# Patient Record
Sex: Female | Born: 1945 | Race: Black or African American | Hispanic: No | Marital: Single | Smoking: Former smoker
Health system: Southern US, Community
[De-identification: ages and names within clinical notes are randomized; demographics above are authoritative.]

## PROBLEM LIST (undated history)

## (undated) DIAGNOSIS — F4322 Adjustment disorder with anxiety: Secondary | ICD-10-CM

## (undated) DIAGNOSIS — D751 Secondary polycythemia: Secondary | ICD-10-CM

## (undated) DIAGNOSIS — I890 Lymphedema, not elsewhere classified: Secondary | ICD-10-CM

## (undated) DIAGNOSIS — I82409 Acute embolism and thrombosis of unspecified deep veins of unspecified lower extremity: Secondary | ICD-10-CM

## (undated) DIAGNOSIS — E119 Type 2 diabetes mellitus without complications: Secondary | ICD-10-CM

## (undated) DIAGNOSIS — I10 Essential (primary) hypertension: Secondary | ICD-10-CM

## (undated) DIAGNOSIS — D45 Polycythemia vera: Secondary | ICD-10-CM

## (undated) HISTORY — PX: SPLENECTOMY: SUR1306

## (undated) HISTORY — PX: REPLACEMENT TOTAL KNEE: SUR1224

## (undated) HISTORY — PX: ABDOMINAL HYSTERECTOMY: SHX81

---

## 1990-03-16 HISTORY — PX: IVC FILTER INSERTION: CATH118245

## 2015-09-15 ENCOUNTER — Emergency Department: Payer: Medicare Other

## 2015-09-15 ENCOUNTER — Encounter: Payer: Self-pay | Admitting: Emergency Medicine

## 2015-09-15 ENCOUNTER — Emergency Department
Admission: EM | Admit: 2015-09-15 | Discharge: 2015-09-15 | Disposition: A | Discharge status: Home / Self Care | Payer: Medicare Other | Attending: Emergency Medicine | Admitting: Emergency Medicine

## 2015-09-15 DIAGNOSIS — M79671 Pain in right foot: Secondary | ICD-10-CM | POA: Diagnosis not present

## 2015-09-15 DIAGNOSIS — I82511 Chronic embolism and thrombosis of right femoral vein: Secondary | ICD-10-CM | POA: Insufficient documentation

## 2015-09-15 DIAGNOSIS — Z86718 Personal history of other venous thrombosis and embolism: Secondary | ICD-10-CM | POA: Insufficient documentation

## 2015-09-15 DIAGNOSIS — M79661 Pain in right lower leg: Secondary | ICD-10-CM | POA: Diagnosis present

## 2015-09-15 HISTORY — DX: Acute embolism and thrombosis of unspecified deep veins of unspecified lower extremity: I82.409

## 2015-09-15 HISTORY — DX: Other disorders of iron metabolism: E83.19

## 2015-09-15 HISTORY — DX: Polycythemia vera: D45

## 2015-09-15 NOTE — ED Notes (Signed)
Patient to ER for c/o foot swelling to right foot. States has h/o DVT. Was seen at urgent care yesterday and evaluated for foot swelling (had negative xray). Denies any pain to calf, but states foot is painful.

## 2015-09-15 NOTE — Discharge Instructions (Signed)

## 2015-09-15 NOTE — ED Provider Notes (Signed)
[facility] Emergency Department Provider Note  ____________________________________________    I have reviewed the triage vital signs and the nursing notes.   HISTORY  Chief Complaint Leg Pain    HPI April Alexander is a 70 y.o. female who presents with complaints of mild swelling to the right foot. Patient reports a long history of DVTs and is on Xarelto and also has an IVC filter. She reports she drove to [facility] 2 weeks ago and since then has had right foot swelling and discomfort. She had x-rays urgent care yesterday which were normal. She denies injury to the area. No shortness of breath as     Past Medical History  Diagnosis Date  . DVT (deep venous thrombosis) ([place])   . Polycythemia vera ([place])   . Iron overload     There are no active problems to display for this patient.   Past Surgical History  Procedure Laterality Date  . Replacement total knee Left   . Splenectomy    . Abdominal hysterectomy      No current outpatient prescriptions on file.  Allergies Bactrim and Neosporin  No family history on file.  Social History Social History  Substance Use Topics  . Smoking status: Never Smoker   . Smokeless tobacco: None  . Alcohol Use: Yes     Comment: occ.    Review of Systems  Constitutional: Negative for fever.   Respiratory: Negative for shortness of breath.  Musculoskeletal: Right foot pain as above Skin: Negative for rash. Neurological: Negative for focal weakness Psychiatric: no anxiety    ____________________________________________   PHYSICAL EXAM:  VITAL SIGNS: ED Triage Vitals  Enc Vitals Group     BP 09/15/15 1147 192/70 mmHg     Pulse Rate 09/15/15 1147 61     Resp 09/15/15 1147 18     Temp 09/15/15 1147 98.3 F (36.8 C)     Temp Source 09/15/15 1147 Oral     SpO2 09/15/15 1147 98 %     Weight 09/15/15 1147 238 lb (107.956 kg)     Height 09/15/15 1147 5\' 3"  (1.6 m)     Head Cir --      Peak  Flow --      Pain Score 09/15/15 1150 5     Pain Loc --      Pain Edu? --      Excl. in [place]? --    Constitutional: Alert and oriented. Well appearing and in no distress.  Eyes: Conjunctivae are normal. No erythema or injection ENT   Head: Normocephalic and atraumatic.   Mouth/Throat: Mucous membranes are moist. Cardiovascular: Normal rate, regular rhythm. Normal DP pulses bilateral lower extremities, feet warm and well perfused Respiratory: Normal respiratory effort without tachypnea nor retractions. Gastrointestinal: Soft and non-tender in all quadrants. No distention. There is no CVA tenderness. Genitourinary: deferred Musculoskeletal: Nontender with normal range of motion in all extremities. Patient was tender to palpation along the right lateral middle of the foot with mild swelling associated. Foot is warm and well perfused, full range of motion Neurologic:  Normal speech and language. No gross focal neurologic deficits are appreciated. Skin:  Skin is warm, dry and intact. No rash noted. Psychiatric: Mood and affect are normal. Patient exhibits appropriate insight and judgment.  ____________________________________________    LABS (pertinent positives/negatives)  Labs Reviewed - No data to display  ____________________________________________   EKG  None  ____________________________________________    RADIOLOGY  Ultrasound shows chronic right profunda femoral thrombus,  nonocclusive  ____________________________________________   PROCEDURES  Procedure(s) performed: none  Critical Care performed: none  ____________________________________________   INITIAL IMPRESSION / ASSESSMENT AND PLAN / ED COURSE  Pertinent labs & imaging results that were available during my care of the patient were reviewed by me and considered in my medical decision making (see chart for details).  Patient is concerned about DVT given her extensive history, although exam is  more consistent with inflammatory cause of pain. We will obtain ultrasound of the right lower extremity and reevaluate  Ultrasound shows likely chronic DVT, patient is already on Xarelto and has IVC filter. Do not think chronic DVT is the cause of the pain that she is experiencing. This seems far more muscular skeletal. She will treat with rice and Tylenol and follow-up with her PCP  ____________________________________________   FINAL CLINICAL IMPRESSION(S) / ED DIAGNOSES  Final diagnoses:  Foot pain, right  Chronic deep vein thrombosis (DVT) of femoral vein of right lower extremity (HCC)          Lavonia Drafts, MD 09/15/15 1535

## 2015-09-15 NOTE — ED Notes (Signed)
Pt transported to ultrasound.

## 2016-03-16 HISTORY — PX: PORTA CATH INSERTION: CATH118285

## 2016-06-05 ENCOUNTER — Other Ambulatory Visit: Payer: Self-pay | Admitting: Podiatry

## 2016-07-09 ENCOUNTER — Other Ambulatory Visit: Payer: Medicare Other

## 2016-07-28 ENCOUNTER — Encounter
Admission: RE | Admit: 2016-07-28 | Discharge: 2016-07-28 | Disposition: A | Discharge status: Home / Self Care | Payer: Medicare Other | Source: Ambulatory Visit | Attending: Podiatry | Admitting: Podiatry

## 2016-07-28 DIAGNOSIS — Z01810 Encounter for preprocedural cardiovascular examination: Secondary | ICD-10-CM | POA: Diagnosis present

## 2016-07-28 DIAGNOSIS — E119 Type 2 diabetes mellitus without complications: Secondary | ICD-10-CM | POA: Insufficient documentation

## 2016-07-28 DIAGNOSIS — Z01812 Encounter for preprocedural laboratory examination: Secondary | ICD-10-CM | POA: Insufficient documentation

## 2016-07-28 DIAGNOSIS — Z01818 Encounter for other preprocedural examination: Secondary | ICD-10-CM | POA: Diagnosis not present

## 2016-07-28 HISTORY — DX: Lymphedema, not elsewhere classified: I89.0

## 2016-07-28 HISTORY — DX: Type 2 diabetes mellitus without complications: E11.9

## 2016-07-28 HISTORY — DX: Secondary polycythemia: D75.1

## 2016-07-28 HISTORY — DX: Adjustment disorder with anxiety: F43.22

## 2016-07-28 HISTORY — DX: Essential (primary) hypertension: I10

## 2016-07-28 NOTE — Pre-Procedure Instructions (Signed)
Incentive spirometer given to patient; instructed on its use postoperatively. Patient acknowledges understanding.

## 2016-07-28 NOTE — Patient Instructions (Signed)
  Your procedure is scheduled on: Friday, Aug 07, 2016 Report to Same Day Surgery 2nd floor medical mall ([place] Entrance-take elevator on left to 2nd floor.  Check in with surgery information desk.) To find out your arrival time please call [phone] between 1PM - 3PM on Thursday, May 24  Remember: Instructions that are not followed completely may result in serious medical risk, up to and including death, or upon the discretion of your surgeon and anesthesiologist your surgery may need to be rescheduled.    _x___ 1. Do not eat food or drink liquids after midnight. No gum chewing or hard candies.      __x__ 2. No Alcohol for 24 hours before or after surgery.   __x__3. No Smoking for 24 prior to surgery.   ____  4. Bring all medications with you on the day of surgery if instructed.    __x__ 5. Notify your doctor if there is any change in your medical condition     (cold, fever, infections).     Do not wear jewelry, make-up, hairpins, clips or nail polish.  Do not wear lotions, powders, or perfumes. You may wear deodorant.  Do not shave 48 hours prior to surgery. Men may shave face and neck.  Do not bring valuables to the hospital.    [facility] is not responsible for any belongings or valuables.               Contacts, dentures or bridgework may not be worn into surgery.     Patients discharged the day of surgery will not be allowed to drive home.  You will need someone to drive you home and stay with you the night of your procedure.    Please read over the following fact sheets that you were given:   [facility] Preparing for Surgery and or MRSA Information   _x___ Take these medications the morning of surgery with a SIP of water the morning of surgery:   1. BYSTOLIC (NEBIVOLOL)    _x___ Use CHG Soap or sage wipes as directed on instruction sheet    _x___ Follow recommendations from HEMATOLOGIST regarding stopping XARELTO.  X____Stop Anti-inflammatories such as  Advil, Aleve, Ibuprofen, Motrin, Naproxen, Naprosyn, Goodies powders or aspirin products. OK to take Tylenol.   _x___ Stop supplements until after surgery; (CO-ENZYME 10)

## 2016-07-30 ENCOUNTER — Encounter (INDEPENDENT_AMBULATORY_CARE_PROVIDER_SITE_OTHER): Payer: Self-pay

## 2016-07-31 NOTE — Pre-Procedure Instructions (Signed)
EKG sent to Anesthesia for review. 

## 2016-08-06 MED ORDER — CEFAZOLIN SODIUM-DEXTROSE 2-4 GM/100ML-% IV SOLN
2.0000 g | INTRAVENOUS | Status: AC
  Administered 2016-08-07: 2 g via INTRAVENOUS

## 2016-08-07 ENCOUNTER — Ambulatory Visit
Admission: RE | Admit: 2016-08-07 | Discharge: 2016-08-07 | Disposition: A | Discharge status: Home / Self Care | Payer: Medicare Other | Source: Ambulatory Visit | Attending: Podiatry | Admitting: Podiatry

## 2016-08-07 ENCOUNTER — Encounter
Admission: RE | Disposition: A | Discharge status: Home / Self Care | Payer: Self-pay | Source: Ambulatory Visit | Attending: Podiatry

## 2016-08-07 ENCOUNTER — Ambulatory Visit: Payer: Medicare Other | Admitting: Certified Registered Nurse Anesthetist

## 2016-08-07 ENCOUNTER — Encounter: Payer: Self-pay | Admitting: *Deleted

## 2016-08-07 DIAGNOSIS — M2042 Other hammer toe(s) (acquired), left foot: Secondary | ICD-10-CM | POA: Diagnosis not present

## 2016-08-07 DIAGNOSIS — M205X2 Other deformities of toe(s) (acquired), left foot: Secondary | ICD-10-CM | POA: Diagnosis present

## 2016-08-07 DIAGNOSIS — E119 Type 2 diabetes mellitus without complications: Secondary | ICD-10-CM | POA: Diagnosis not present

## 2016-08-07 DIAGNOSIS — F4322 Adjustment disorder with anxiety: Secondary | ICD-10-CM | POA: Insufficient documentation

## 2016-08-07 DIAGNOSIS — Z79899 Other long term (current) drug therapy: Secondary | ICD-10-CM | POA: Insufficient documentation

## 2016-08-07 DIAGNOSIS — Z7984 Long term (current) use of oral hypoglycemic drugs: Secondary | ICD-10-CM | POA: Diagnosis not present

## 2016-08-07 DIAGNOSIS — M2022 Hallux rigidus, left foot: Secondary | ICD-10-CM | POA: Insufficient documentation

## 2016-08-07 DIAGNOSIS — Z87891 Personal history of nicotine dependence: Secondary | ICD-10-CM | POA: Insufficient documentation

## 2016-08-07 DIAGNOSIS — I10 Essential (primary) hypertension: Secondary | ICD-10-CM | POA: Diagnosis not present

## 2016-08-07 HISTORY — PX: ARTHRODESIS METATARSALPHALANGEAL JOINT (MTPJ): SHX6566

## 2016-08-07 HISTORY — PX: HAMMER TOE SURGERY: SHX385

## 2016-08-07 LAB — GLUCOSE, CAPILLARY
Glucose-Capillary: 122 mg/dL — ABNORMAL HIGH (ref 65–99)
Glucose-Capillary: 113 mg/dL — ABNORMAL HIGH (ref 65–99)

## 2016-08-07 SURGERY — FUSION, JOINT, GREAT TOE
Anesthesia: General | Laterality: Left

## 2016-08-07 MED ORDER — ENOXAPARIN SODIUM 40 MG/0.4ML ~~LOC~~ SOLN
40.0000 mg | SUBCUTANEOUS | Status: AC
  Administered 2016-08-07: 40 mg via SUBCUTANEOUS
  Filled 2016-08-07: qty 0.4

## 2016-08-07 MED ORDER — HYDROCODONE-ACETAMINOPHEN 5-325 MG PO TABS: 1.0000 | ORAL_TABLET | Freq: Four times a day (QID) | ORAL | 0 refills | Status: AC | PRN

## 2016-08-07 MED ORDER — FENTANYL CITRATE (PF) 100 MCG/2ML IJ SOLN
INTRAMUSCULAR | Status: DC | PRN
  Administered 2016-08-07 (×2): 25 ug via INTRAVENOUS

## 2016-08-07 MED ORDER — CHLORHEXIDINE GLUCONATE 4 % EX LIQD: 60.0000 mL | Freq: Once | CUTANEOUS | Status: DC

## 2016-08-07 MED ORDER — ONDANSETRON HCL 4 MG/2ML IJ SOLN
4.0000 mg | Freq: Once | INTRAMUSCULAR | Status: DC | PRN
Start: 2016-08-07 — End: 2016-08-07

## 2016-08-07 MED ORDER — MIDAZOLAM HCL 2 MG/2ML IJ SOLN
INTRAMUSCULAR | Status: AC
  Administered 2016-08-07: 1.5 mg via INTRAVENOUS
  Filled 2016-08-07: qty 2

## 2016-08-07 MED ORDER — LIDOCAINE-EPINEPHRINE 1 %-1:100000 IJ SOLN
INTRAMUSCULAR | Status: AC
  Filled 2016-08-07: qty 1

## 2016-08-07 MED ORDER — PROPOFOL 10 MG/ML IV BOLUS
INTRAVENOUS | Status: DC | PRN
  Administered 2016-08-07: 200 mg via INTRAVENOUS

## 2016-08-07 MED ORDER — ACETAMINOPHEN 10 MG/ML IV SOLN
INTRAVENOUS | Status: AC
  Filled 2016-08-07: qty 100

## 2016-08-07 MED ORDER — FENTANYL CITRATE (PF) 100 MCG/2ML IJ SOLN
INTRAMUSCULAR | Status: AC
  Filled 2016-08-07: qty 2

## 2016-08-07 MED ORDER — LACTATED RINGERS IV SOLN
INTRAVENOUS | Status: DC | PRN
  Administered 2016-08-07: 12:00:00 via INTRAVENOUS

## 2016-08-07 MED ORDER — BUPIVACAINE HCL (PF) 0.25 % IJ SOLN
INTRAMUSCULAR | Status: AC
  Filled 2016-08-07: qty 30

## 2016-08-07 MED ORDER — ROPIVACAINE HCL 5 MG/ML IJ SOLN
INTRAMUSCULAR | Status: AC
  Filled 2016-08-07: qty 30

## 2016-08-07 MED ORDER — MIDAZOLAM HCL 2 MG/2ML IJ SOLN
INTRAMUSCULAR | Status: AC
  Filled 2016-08-07: qty 2

## 2016-08-07 MED ORDER — PHENYLEPHRINE HCL 10 MG/ML IJ SOLN
INTRAMUSCULAR | Status: DC | PRN
  Administered 2016-08-07 (×2): 50 ug via INTRAVENOUS

## 2016-08-07 MED ORDER — ACETAMINOPHEN 10 MG/ML IV SOLN
INTRAVENOUS | Status: DC | PRN
Start: 2016-08-07 — End: 2016-08-07
  Administered 2016-08-07: 1000 mg via INTRAVENOUS

## 2016-08-07 MED ORDER — LIDOCAINE HCL (PF) 2 % IJ SOLN
INTRAMUSCULAR | Status: AC
  Filled 2016-08-07: qty 2

## 2016-08-07 MED ORDER — MIDAZOLAM HCL 2 MG/2ML IJ SOLN
INTRAMUSCULAR | Status: DC | PRN
  Administered 2016-08-07: 2 mg via INTRAVENOUS

## 2016-08-07 MED ORDER — PROMETHAZINE HCL 12.5 MG PO TABS: 12.5000 mg | ORAL_TABLET | Freq: Four times a day (QID) | ORAL | 0 refills | Status: AC | PRN

## 2016-08-07 MED ORDER — PROPOFOL 10 MG/ML IV BOLUS
INTRAVENOUS | Status: AC
  Filled 2016-08-07: qty 20

## 2016-08-07 MED ORDER — FAMOTIDINE 20 MG PO TABS
ORAL_TABLET | ORAL | Status: AC
  Administered 2016-08-07: 20 mg via ORAL
  Filled 2016-08-07: qty 1

## 2016-08-07 MED ORDER — BUPIVACAINE HCL (PF) 0.5 % IJ SOLN
INTRAMUSCULAR | Status: AC
  Filled 2016-08-07: qty 30

## 2016-08-07 MED ORDER — LIDOCAINE-EPINEPHRINE (PF) 1 %-1:200000 IJ SOLN
INTRAMUSCULAR | Status: AC
  Filled 2016-08-07: qty 30

## 2016-08-07 MED ORDER — FENTANYL CITRATE (PF) 100 MCG/2ML IJ SOLN: 25.0000 ug | INTRAMUSCULAR | Status: DC | PRN

## 2016-08-07 MED ORDER — CEFAZOLIN SODIUM-DEXTROSE 2-4 GM/100ML-% IV SOLN
INTRAVENOUS | Status: AC
  Filled 2016-08-07: qty 100

## 2016-08-07 MED ORDER — ONDANSETRON HCL 4 MG/2ML IJ SOLN
INTRAMUSCULAR | Status: DC | PRN
  Administered 2016-08-07: 4 mg via INTRAVENOUS

## 2016-08-07 MED ORDER — LIDOCAINE HCL (PF) 1 % IJ SOLN
INTRAMUSCULAR | Status: AC
  Filled 2016-08-07: qty 30

## 2016-08-07 MED ORDER — SODIUM CHLORIDE 0.9 % IV SOLN
INTRAVENOUS | Status: DC
  Administered 2016-08-07: 09:00:00 via INTRAVENOUS

## 2016-08-07 MED ORDER — LIDOCAINE HCL (PF) 1 % IJ SOLN
INTRAMUSCULAR | Status: AC
  Filled 2016-08-07: qty 5

## 2016-08-07 MED ORDER — ONDANSETRON HCL 4 MG PO TABS: 4.0000 mg | ORAL_TABLET | Freq: Four times a day (QID) | ORAL | Status: DC | PRN

## 2016-08-07 MED ORDER — MIDAZOLAM HCL 2 MG/2ML IJ SOLN
1.5000 mg | Freq: Once | INTRAMUSCULAR | Status: AC
  Administered 2016-08-07: 1.5 mg via INTRAVENOUS

## 2016-08-07 MED ORDER — LIDOCAINE HCL (CARDIAC) 20 MG/ML IV SOLN
INTRAVENOUS | Status: DC | PRN
  Administered 2016-08-07: 100 mg via INTRAVENOUS

## 2016-08-07 MED ORDER — BUPIVACAINE HCL (PF) 0.25 % IJ SOLN
INTRAMUSCULAR | Status: DC | PRN
  Administered 2016-08-07: 10 mL

## 2016-08-07 MED ORDER — LIDOCAINE-EPINEPHRINE 1 %-1:100000 IJ SOLN
INTRAMUSCULAR | Status: DC | PRN
  Administered 2016-08-07: 7 mL

## 2016-08-07 MED ORDER — ONDANSETRON HCL 4 MG/2ML IJ SOLN: 4.0000 mg | Freq: Four times a day (QID) | INTRAMUSCULAR | Status: DC | PRN

## 2016-08-07 MED ORDER — HYDROCODONE-ACETAMINOPHEN 5-325 MG PO TABS: 1.0000 | ORAL_TABLET | ORAL | Status: DC | PRN

## 2016-08-07 MED ORDER — FAMOTIDINE 20 MG PO TABS
20.0000 mg | ORAL_TABLET | Freq: Once | ORAL | Status: AC
  Administered 2016-08-07: 20 mg via ORAL

## 2016-08-07 SURGICAL SUPPLY — 58 items
1.3MM SQUARE DRIVER IMPLANT
BANDAGE ELASTIC 4 LF NS (GAUZE/BANDAGES/DRESSINGS) ×2 IMPLANT
BANDAGE STRETCH 3X4.1 STRL (GAUZE/BANDAGES/DRESSINGS) ×2 IMPLANT
BIT DRILL 2 FAST STEP (BIT) ×2 IMPLANT
BIT DRILL 2.4 AO COUPLING CANN (BIT) ×2 IMPLANT
BLADE OSC/SAGITTAL MD 5.5X18 (BLADE) ×2 IMPLANT
BLADE SURG 15 STRL LF DISP TIS (BLADE) ×2 IMPLANT
BLADE SURG 15 STRL SS (BLADE) ×2
BLADE SURG MINI STRL (BLADE) ×2 IMPLANT
BNDG ESMARK 4X12 TAN STRL LF (GAUZE/BANDAGES/DRESSINGS) ×2 IMPLANT
BNDG GAUZE 4.5X4.1 6PLY STRL (MISCELLANEOUS) ×2 IMPLANT
BOOT LEFT CAM WALKER (MISCELLANEOUS) ×1 IMPLANT
COMPRESSION WIRE, SHORT IMPLANT
DEVICE LEFT CAM WALKER (MISCELLANEOUS) ×2
DRIVER BIT SQUARE 1.3MM (TRAUMA) ×4 IMPLANT
DURAPREP 26ML APPLICATOR (WOUND CARE) ×2 IMPLANT
ELECT REM PT RETURN 9FT ADLT (ELECTROSURGICAL) ×2
ELECTRODE REM PT RTRN 9FT ADLT (ELECTROSURGICAL) ×1 IMPLANT
FIXATION HAMMERTOE ANGLD 15MM (Toe) ×3 IMPLANT
GAUZE PETRO XEROFOAM 1X8 (MISCELLANEOUS) ×2 IMPLANT
GAUZE SPONGE 4X4 12PLY STRL (GAUZE/BANDAGES/DRESSINGS) ×2 IMPLANT
GAUZE STRETCH 2X75IN STRL (MISCELLANEOUS) ×2 IMPLANT
GLOVE BIO SURGEON STRL SZ7.5 (GLOVE) ×4 IMPLANT
GLOVE INDICATOR 8.0 STRL GRN (GLOVE) ×4 IMPLANT
GOWN STRL REUS W/ TWL LRG LVL3 (GOWN DISPOSABLE) ×2 IMPLANT
GOWN STRL REUS W/TWL LRG LVL3 (GOWN DISPOSABLE) ×2
HAMMERTOE ANGLED 15MM 5MM (Toe) ×6 IMPLANT
K-WIRE ACE 1.6X6 (WIRE) ×2
K-WIRE TROC 1.25X150 (WIRE) ×2
KIT DRILL HAMMERLOCK2 IMPLANT (BIT) ×2 IMPLANT
KIT RM TURNOVER STRD PROC AR (KITS) ×2 IMPLANT
KWIRE ACE 1.6X6 (WIRE) ×1 IMPLANT
KWIRE TROC 1.25X150 (WIRE) ×1 IMPLANT
LABEL OR SOLS (LABEL) IMPLANT
NEEDLE FILTER BLUNT 18X 1/2SAF (NEEDLE) ×1
NEEDLE FILTER BLUNT 18X1 1/2 (NEEDLE) ×1 IMPLANT
NEEDLE HYPO 25X1 1.5 SAFETY (NEEDLE) ×6 IMPLANT
NS IRRIG 500ML POUR BTL (IV SOLUTION) ×2 IMPLANT
PACK EXTREMITY ARMC (MISCELLANEOUS) ×2 IMPLANT
PAD CAST CTTN 4X4 STRL (SOFTGOODS) ×1 IMPLANT
PADDING CAST COTTON 4X4 STRL (SOFTGOODS) ×1
PEG FULLY THREADED 2.5X22MM (Peg) ×2 IMPLANT
PENCIL ELECTRO HAND CTR (MISCELLANEOUS) ×2 IMPLANT
PLATE LOCK 1ST LT MTP (Plate) ×2 IMPLANT
RASP SM TEAR CROSS CUT (RASP) ×2 IMPLANT
SCREW PEG 2.5X20 NONLOCK (Screw) ×2 IMPLANT
SCREW PEG LOCK 2.5X10 (Peg) ×2 IMPLANT
SCREW PEG LOCK 2.5X14 (Peg) ×2 IMPLANT
SCREW PEG LOCK 2.5X16 (Peg) ×6 IMPLANT
SCREW PT 4.0X26MM (Screw) ×2 IMPLANT
SPONGE XRAY 4X4 16PLY STRL (MISCELLANEOUS) ×2 IMPLANT
STOCKINETTE M/LG 89821 (MISCELLANEOUS) ×2 IMPLANT
STRIP CLOSURE SKIN 1/4X4 (GAUZE/BANDAGES/DRESSINGS) ×2 IMPLANT
SUT MON AB 5-0 P3 18 (SUTURE) ×2 IMPLANT
SUT VIC AB 4-0 FS2 27 (SUTURE) ×6 IMPLANT
SYRINGE 10CC LL (SYRINGE) ×4 IMPLANT
WIRE COMPRESSION 23 SHORT (WIRE) ×4 IMPLANT
WIRE Z .062 C-WIRE SPADE TIP (WIRE) IMPLANT

## 2016-08-07 NOTE — Anesthesia Post-op Follow-up Note (Cosign Needed)
Anesthesia QCDR form completed.        

## 2016-08-07 NOTE — Op Note (Signed)
Operative note   Surgeon:Markanthony Gedney Vickki Muff    Assistant:Dr. Sharlotte Alamo     Preop diagnosis:1. Left foot hallux limitus/rigidus 2.Hammertoes 2,3,4 left foot.    Postop diagnosis:Same    Procedure: 1. Left first MTPJ fusion 2. Hammertoe repair with PIPJ arthrodesis with hammerlock implants second, third, fourth toes all left foot    EBL: 10 cc    Anesthesia:regional and general    Hemostasis: Epinephrine infiltrated along the incision sites    Specimen: None    Complications: None    Operative indications:April Alexander is an 71 y.o. that presents today for surgical intervention.  The risks/benefits/alternatives/complications have been discussed and consent has been given.    Procedure:  Patient was brought into the OR and placed on the operating table in thesupine position. After anesthesia was obtained theleft lower extremity was prepped and draped in usual sterile fashion.  Attention was directed to the dorsomedial left first MTPJ where an incision was performed. Sharp and blunt dissection carried down to the capsule. A longitudinal capsulotomy was performed. The head of the metatarsal and base of the proximal phalanx were exposed. There was virtually no articular cartilage remaining. At this time what little bit of cartilage remaining was removed with a curette. Next a cup and cone reamer were used to remove the subchondral bone plate down to good healthy bleeding bone proximal and distal. At this time the toe was held in a anatomic position with slight dorsiflexion and alignment with the lesser toes. The fusion site was prepped with drilling with a 2.0 mm drill bit. A dorsal locking Biomet small MTPJ fusion plate was initially fashion with 1 locking screw distal and a compression screw proximal. Next a 4.0 mm compression cannulated screw was placed from distal medial to proximal lateral with good alignment and compression noted. The remaining plate was filled with locking screws at this  time. The compression screw was removed from the plate. Good stability and alignment was noted in all planes. The wound was flushed with copious amounts or irrigation and layered closure was performed with a 3-0 Vicryl the Deeper Layer and 4-0 Vicryl for the Subcutaneous Tissue. The skin was closed with 5-0 Monocryl at the end of the case.  Attention was then directed to the second third and fourth toes were longitudinal incision was made overlying the PIPJ's. Sharp and blunt dissection down to the long extensor tendon. These were transected and reflected back exposing the head of the proximal phalanx and base of the middle phalanx. The head of the proximal phalanx were removed to the surgical neck and the cartilage from the base of the middle phalanx were removed. Next using standard technique 3 medium sized curved hammerlock implants were placed into the second third and fourth PIPJ's perfusion. Good compression and stability was noted with good alignment noted. All wounds were flushed with copious amounts or irrigation. Closure was then performed with a 4-0 Vicryl for the extensor tendon and subcutaneous tissue and a 4-0 nylon for skin.    Patient tolerated the procedure and anesthesia well.  Was transported from the OR to the PACU with all vital signs stable and vascular status intact. To be discharged per routine protocol.  Will follow up in approximately 1 week in the outpatient clinic.

## 2016-08-07 NOTE — Anesthesia Procedure Notes (Signed)
Anesthesia Regional Block: Popliteal block   Pre-Anesthetic Checklist: ,, timeout performed, Correct Patient, Correct Site, Correct Laterality, Correct Procedure, Correct Position, site marked, Risks and benefits discussed,  Surgical consent,  Pre-op evaluation,  At surgeon's request and post-op pain management  Laterality: Lower and Left  Prep: chloraprep       Needles:  Injection technique: Single-shot  Needle Type: Echogenic Needle     Needle Length: 9cm  Needle Gauge: 21     Additional Needles:   Procedures: ultrasound guided,,,,,,,,  Narrative:  Start time: 08/07/2016 9:00 AM End time: 08/07/2016 9:12 AM Injection made incrementally with aspirations every 5 mL.  Performed by: Personally  Anesthesiologist: Katy Fitch K  Additional Notes: Time out performed with Functioning IV  confirmed and monitors applied.  An echogenic needle was used. Sterile prep,hand hygiene and sterile gloves were used. Minimal sedation used for procedure.   No paresthesia endorsed by patient during the procedure.  Negative aspiration and negative test dose prior to incremental administration of local anesthetic. The patient tolerated the procedure well with no immediate complications.

## 2016-08-07 NOTE — H&P (Signed)
HISTORY AND PHYSICAL INTERVAL NOTE:  08/07/2016  8:54 AM  April Alexander  has presented today for surgery, with the diagnosis of Hallux rigidus-Left   Hammertoe-Left  .  The various methods of treatment have been discussed with the patient.  No guarantees were given.  After consideration of risks, benefits and other options for treatment, the patient has consented to surgery.  I have reviewed the patients' chart and labs.    Patient Vitals for the past 24 hrs:  BP Temp Temp src Pulse Resp SpO2  08/07/16 0847 (!) 185/81 - - (!) 53 12 100 %  08/07/16 0804 (!) 181/77 97.6 F (36.4 C) Oral 69 16 99 %    A history and physical examination was performed in my office.  The patient was reexamined.  There have been no changes to this history and physical examination.  Samara Deist A

## 2016-08-07 NOTE — Anesthesia Procedure Notes (Signed)
Procedure Name: LMA Insertion Performed by: Demetrius Charity Pre-anesthesia Checklist: Patient identified, Patient being monitored, Timeout performed, Emergency Drugs available and Suction available Patient Re-evaluated:Patient Re-evaluated prior to inductionOxygen Delivery Method: Circle system utilized Preoxygenation: Pre-oxygenation with 100% oxygen Intubation Type: IV induction Ventilation: Mask ventilation without difficulty LMA: LMA inserted LMA Size: 3.0 Tube type: Oral Number of attempts: 1 Placement Confirmation: positive ETCO2 and breath sounds checked- equal and bilateral Tube secured with: Tape Dental Injury: Teeth and Oropharynx as per pre-operative assessment

## 2016-08-07 NOTE — Discharge Instructions (Signed)
[facility] °[facility] ° °POST OPERATIVE INSTRUCTIONS FOR DR. TROXLER AND DR. FOWLER °[facility] PODIATRY [facility]PART[facility] ° ° °1. Take your medication as prescribed.  Pain medication should be taken only as needed. ° °2. Keep the dressing clean, dry and intact. ° °3. Keep your foot elevated above the heart level for the first 48 hours. ° °4. Walking to the bathroom and brief periods of walking are acceptable, unless we have instructed you to be non-weight bearing. ° °5. Always wear your post-op shoe when walking.  Always use your crutches if you are to be non-weight bearing. ° °6. Do not take a shower. Baths are permissible as long as the foot is kept out of the water.  ° °7. Every hour you are awake:  °- Bend your knee 15 times. °- Flex foot 15 times °- Massage calf 15 times ° °8. Call [facility] ([phone]) if any of the following problems occur: °- You develop a temperature or fever. °- The bandage becomes saturated with blood. °- Medication does not stop your pain. °- Injury of the foot occurs. °- Any symptoms of infection including redness, odor, or red streaks running from wound. ° ° ° ° °AMBULATORY SURGERY  °DISCHARGE INSTRUCTIONS ° ° °1) The drugs that you were given will stay in your system until tomorrow so for the next 24 hours you should not: ° °A) Drive an automobile °B) Make any legal decisions °C) Drink any alcoholic beverage ° ° °2) You may resume regular meals tomorrow.  Today it is better to start with liquids and gradually work up to solid foods. ° °You may eat anything you prefer, but it is better to start with liquids, then soup and crackers, and gradually work up to solid foods. ° ° °3) Please notify your doctor immediately if you have any unusual bleeding, trouble breathing, redness and pain at the surgery site, drainage, fever, or pain not relieved by medication. ° ° ° °4) Additional Instructions: ° ° ° ° ° ° ° °Please contact your physician with any  problems or Same Day Surgery at [phone], Monday through Friday 6 am to 4 pm, or [facility] number at [phone]. ° °

## 2016-08-07 NOTE — Transfer of Care (Signed)
Immediate Anesthesia Transfer of Care Note  Patient: April Alexander  Procedure(s) Performed: Procedure(s): ARTHRODESIS METATARSALPHALANGEAL JOINT (MTPJ)-Left  (Left) HAMMER TOE CORRECTION-Left 2nd, 3rd & 4th left toes  (Left)  Patient Location: PACU  Anesthesia Type:General  Level of Consciousness: awake, alert  and oriented  Airway & Oxygen Therapy: Patient Spontanous Breathing and Patient connected to face mask oxygen  Post-op Assessment: Report given to RN and Post -op Vital signs reviewed and stable  Post vital signs: Reviewed and stable  Last Vitals:  Vitals:   08/07/16 0922 08/07/16 0927  BP: (!) 170/66   Pulse: (!) 53 (!) 50  Resp: 16 16  Temp:      Last Pain:  Vitals:   08/07/16 0847  TempSrc:   PainSc: 0-No pain         Complications: No apparent anesthesia complications

## 2016-08-07 NOTE — Anesthesia Preprocedure Evaluation (Signed)
Anesthesia Evaluation  Patient identified by MRN, date of birth, ID band Patient awake    Reviewed: Allergy & Precautions, H&P , NPO status , Patient's Chart, lab work & pertinent test results, reviewed documented beta blocker date and time   Airway Mallampati: II  TM Distance: >3 FB Neck ROM: full    Dental  (+) Teeth Intact   Pulmonary neg pulmonary ROS, former smoker,    Pulmonary exam normal        Cardiovascular Exercise Tolerance: Good hypertension, negative cardio ROS Normal cardiovascular exam Rate:Normal     Neuro/Psych PSYCHIATRIC DISORDERS negative neurological ROS  negative psych ROS   GI/Hepatic negative GI ROS, Neg liver ROS,   Endo/Other  negative endocrine ROSdiabetes  Renal/GU negative Renal ROS  negative genitourinary   Musculoskeletal   Abdominal   Peds  Hematology negative hematology ROS (+)   Anesthesia Other Findings   Reproductive/Obstetrics negative OB ROS                             Anesthesia Physical Anesthesia Plan  ASA: II  Anesthesia Plan: General LMA   Post-op Pain Management:  Regional for Post-op pain   Induction: Intravenous  Airway Management Planned:   Additional Equipment:   Intra-op Plan:   Post-operative Plan:   Informed Consent: I have reviewed the patients History and Physical, chart, labs and discussed the procedure including the risks, benefits and alternatives for the proposed anesthesia with the patient or authorized representative who has indicated his/her understanding and acceptance.     Plan Discussed with: CRNA  Anesthesia Plan Comments:         Anesthesia Quick Evaluation

## 2016-08-11 ENCOUNTER — Encounter: Payer: Self-pay | Admitting: Podiatry

## 2016-08-11 NOTE — Anesthesia Postprocedure Evaluation (Signed)
Anesthesia Post Note  Patient: April Alexander  Procedure(s) Performed: Procedure(s) (LRB): ARTHRODESIS METATARSALPHALANGEAL JOINT (MTPJ)-Left  (Left) HAMMER TOE CORRECTION-Left 2nd, 3rd & 4th left toes  (Left)  Patient location during evaluation: PACU Anesthesia Type: General Level of consciousness: awake and alert Pain management: pain level controlled Vital Signs Assessment: post-procedure vital signs reviewed and stable Respiratory status: spontaneous breathing, nonlabored ventilation, respiratory function stable and patient connected to nasal cannula oxygen Cardiovascular status: blood pressure returned to baseline and stable Postop Assessment: no signs of nausea or vomiting Anesthetic complications: no     Last Vitals:  Vitals:   08/07/16 1306 08/07/16 1324  BP: (!) 164/68 (!) 159/55  Pulse: 84 72  Resp: 16 16  Temp: (!) 36 C     Last Pain:  Vitals:   08/11/16 0828  TempSrc:   PainSc: 0-No pain                 Molli Barrows

## 2018-02-01 ENCOUNTER — Other Ambulatory Visit: Payer: Self-pay | Admitting: Family Medicine

## 2018-02-01 DIAGNOSIS — R0602 Shortness of breath: Secondary | ICD-10-CM

## 2018-02-14 ENCOUNTER — Ambulatory Visit
Admission: RE | Admit: 2018-02-14 | Discharge: 2018-02-14 | Disposition: A | Discharge status: Home / Self Care | Payer: Medicare Other | Source: Ambulatory Visit | Attending: Family Medicine | Admitting: Family Medicine

## 2018-02-14 DIAGNOSIS — R0602 Shortness of breath: Secondary | ICD-10-CM | POA: Insufficient documentation

## 2019-09-14 ENCOUNTER — Other Ambulatory Visit: Payer: Self-pay

## 2019-09-14 ENCOUNTER — Emergency Department
Admission: EM | Admit: 2019-09-14 | Discharge: 2019-09-14 | Disposition: A | Discharge status: Home / Self Care | Payer: Medicare Other | Attending: Emergency Medicine | Admitting: Emergency Medicine

## 2019-09-14 ENCOUNTER — Emergency Department: Payer: Medicare Other

## 2019-09-14 DIAGNOSIS — Z79899 Other long term (current) drug therapy: Secondary | ICD-10-CM | POA: Diagnosis not present

## 2019-09-14 DIAGNOSIS — I10 Essential (primary) hypertension: Secondary | ICD-10-CM | POA: Diagnosis not present

## 2019-09-14 DIAGNOSIS — K5900 Constipation, unspecified: Secondary | ICD-10-CM | POA: Insufficient documentation

## 2019-09-14 DIAGNOSIS — Z87891 Personal history of nicotine dependence: Secondary | ICD-10-CM | POA: Diagnosis not present

## 2019-09-14 DIAGNOSIS — E119 Type 2 diabetes mellitus without complications: Secondary | ICD-10-CM | POA: Insufficient documentation

## 2019-09-14 LAB — BASIC METABOLIC PANEL
Anion gap: 12 (ref 5–15)
BUN: 30 mg/dL — ABNORMAL HIGH (ref 8–23)
CO2: 26 mmol/L (ref 22–32)
Calcium: 9.2 mg/dL (ref 8.9–10.3)
Chloride: 96 mmol/L — ABNORMAL LOW (ref 98–111)
Creatinine, Ser: 1.3 mg/dL — ABNORMAL HIGH (ref 0.44–1.00)
GFR calc Af Amer: 47 mL/min — ABNORMAL LOW (ref >60)
GFR calc non Af Amer: 40 mL/min — ABNORMAL LOW (ref >60)
Glucose, Bld: 140 mg/dL — ABNORMAL HIGH (ref 70–99)
Potassium: 3.7 mmol/L (ref 3.5–5.1)
Sodium: 134 mmol/L — ABNORMAL LOW (ref 135–145)

## 2019-09-14 LAB — CBC
HCT: 42.4 % (ref 36.0–46.0)
Hemoglobin: 14.3 g/dL (ref 12.0–15.0)
MCH: 27.2 pg (ref 26.0–34.0)
MCHC: 33.7 g/dL (ref 30.0–36.0)
MCV: 80.6 fL (ref 80.0–100.0)
Platelets: 312 10*3/uL (ref 150–400)
RBC: 5.26 MIL/uL — ABNORMAL HIGH (ref 3.87–5.11)
RDW: 15.9 % — ABNORMAL HIGH (ref 11.5–15.5)
WBC: 14.1 10*3/uL — ABNORMAL HIGH (ref 4.0–10.5)
nRBC: 0 % (ref 0.0–0.2)

## 2019-09-14 MED ORDER — MAGNESIUM CITRATE PO SOLN: 1.0000 | Freq: Once | ORAL | 1 refills | Status: AC

## 2019-09-14 MED ORDER — POLYETHYLENE GLYCOL 3350 17 G PO PACK: 17.0000 g | PACK | Freq: Every day | ORAL | 0 refills | Status: AC

## 2019-09-14 NOTE — Discharge Instructions (Signed)
Please seek medical attention for any high fevers, chest pain, shortness of breath, change in behavior, persistent vomiting, bloody stool or any other new or concerning symptoms.  

## 2019-09-14 NOTE — ED Notes (Signed)
E-signature not working at this time. Pt verbalized understanding of D/C instructions, prescriptions and follow up care with no further questions at this time. Pt in NAD and ambulatory at time of D/C.  

## 2019-09-14 NOTE — ED Notes (Signed)
Soap suds enema completed by this RN. Pt was able to have multiple large bowel movements. Pt expressed relief.

## 2019-09-14 NOTE — ED Provider Notes (Signed)
[facility] Emergency Department Provider Note   ____________________________________________   I have reviewed the triage vital signs and the nursing notes.   HISTORY  Chief Complaint Constipation   History limited by: Not Limited   HPI April Alexander is a 74 y.o. female who presents to the emergency department today because of concerns for constipation.  Patient states it has been 8 days since she has had a good bowel movement.  She feels the need to have a bowel movement however when she tries to have one she has only gotten a small stool out.  The patient denies any abdominal pain or rectal discomfort.  She did have some nausea and an episode of vomiting today.  She does states she has had decreased appetite since this is started.  She states she has been under a lot of stress with the recent move. She denies any recent changes in diet, but was started on a new medication 2 weeks ago. The patient denies any history of constipation. Has history of splenectomy and hysterectomy. In addition patient does have complaint of bug bite to right shin.   Records reviewed. Per medical record review patient has a history of HTN, DM.   Past Medical History:  Diagnosis Date  . Adjustment disorder with anxiety   . Diabetes mellitus without complication ([place])   . DVT (deep venous thrombosis) ([place])   . Hypertension   . Iron overload   . Lymphedema    right leg  . Polycythemia   . Polycythemia vera ([place])     There are no problems to display for this patient.   Past Surgical History:  Procedure Laterality Date  . ABDOMINAL HYSTERECTOMY    . ARTHRODESIS METATARSALPHALANGEAL JOINT (MTPJ) Left 08/07/2016   Procedure: ARTHRODESIS METATARSALPHALANGEAL JOINT (MTPJ)-Left ;  Surgeon: Samara Deist, DPM;  Location: [facility] ORS;  Service: Podiatry;  Laterality: Left;  . HAMMER TOE SURGERY Left 08/07/2016   Procedure: HAMMER TOE CORRECTION-Left 2nd, 3rd & 4th left toes ;   Surgeon: Samara Deist, DPM;  Location: [facility] ORS;  Service: Podiatry;  Laterality: Left;  . IVC FILTER INSERTION  1992  . PORTA CATH INSERTION Left 2018  . REPLACEMENT TOTAL KNEE Left   . SPLENECTOMY      Prior to Admission medications   Medication Sig Start Date End Date Taking? Authorizing Provider  Biotin 10 MG TABS Take 1 tablet by mouth daily.    [provider]  cholecalciferol (VITAMIN D) 1000 units tablet Take 1,000 Units by mouth daily.    [provider]  Coenzyme Q10 200 MG capsule Take 200 mg by mouth at bedtime.    [provider]  HYDROcodone-acetaminophen (NORCO) 5-325 MG tablet Take 1 tablet by mouth every 6 (six) hours as needed for moderate pain. 08/07/16   Samara Deist, DPM  LORazepam (ATIVAN) 1 MG tablet Take 1 mg by mouth at bedtime as needed for anxiety or sleep.    [provider]  nebivolol (BYSTOLIC) 10 MG tablet Take 10 mg by mouth every morning.    [provider]  promethazine (PHENERGAN) 12.5 MG tablet Take 1 tablet (12.5 mg total) by mouth every 6 (six) hours as needed for nausea. 08/07/16   Samara Deist, DPM  rivaroxaban (XARELTO) 20 MG TABS tablet Take 20 mg by mouth daily with supper.    [provider]  rosuvastatin (CRESTOR) 5 MG tablet Take 5 mg by mouth at bedtime.    [provider]  sitaGLIPtin Celesta Gentile)  50 MG tablet Take 50 mg by mouth every morning.    [provider]  valsartan (DIOVAN) 160 MG tablet Take 160 mg by mouth at bedtime.    [provider]    Allergies Bactrim [sulfamethoxazole-trimethoprim], Neosporin [neomycin-bacitracin zn-polymyx], and Oxycodone  Family History  Problem Relation Age of Onset  . Stroke Mother   . Alcoholism Father     Social History Social History   Tobacco Use  . Smoking status: Former Smoker    Quit date: 1988    Years since quitting: 33.5  . Smokeless tobacco: Never Used  Vaping Use  . Vaping Use: Never used   Substance Use Topics  . Alcohol use: Yes    Comment: occ.  . Drug use: No    Review of Systems Constitutional: No fever/chills Eyes: No visual changes. ENT: No sore throat. Cardiovascular: Denies chest pain. Respiratory: Denies shortness of breath. Gastrointestinal: No abdominal pain. Positive for constipation. Genitourinary: Negative for dysuria. Musculoskeletal: Negative for back pain. Skin: Negative for rash. Neurological: Negative for headaches, focal weakness or numbness.  ____________________________________________   PHYSICAL EXAM:  VITAL SIGNS: ED Triage Vitals  Enc Vitals Group     BP 09/14/19 1405 122/63     Pulse Rate 09/14/19 1405 83     Resp 09/14/19 1405 18     Temp 09/14/19 1405 97.8 F (36.6 C)     Temp Source 09/14/19 1405 Oral     SpO2 09/14/19 1405 100 %     Weight 09/14/19 1406 114 lb (51.7 kg)     Height 09/14/19 1406 5\' 3"  (1.6 m)     Head Circumference --      Peak Flow --      Pain Score 09/14/19 1406 0   Constitutional: Alert and oriented.  Eyes: Conjunctivae are normal.  ENT      Head: Normocephalic and atraumatic.      Nose: No congestion/rhinnorhea.      Mouth/Throat: Mucous membranes are moist.      Neck: No stridor. Hematological/Lymphatic/Immunilogical: No cervical lymphadenopathy. Cardiovascular: Normal rate, regular rhythm.  No murmurs, rubs, or gallops.  Respiratory: Normal respiratory effort without tachypnea nor retractions. Breath sounds are clear and equal bilaterally. No wheezes/rales/rhonchi. Gastrointestinal: Soft and non tender. No rebound. No guarding.  Genitourinary: Deferred Musculoskeletal: Normal range of motion in all extremities. No lower extremity edema. Neurologic:  Normal speech and language. No gross focal neurologic deficits are appreciated.  Skin:  Small area of inflammation to right shin. Psychiatric: Mood and affect are normal. Speech and behavior are normal. Patient exhibits appropriate insight and  judgment.  ____________________________________________    LABS (pertinent positives/negatives)  CBC wbc 14.1, hgb 14.3, plt 312 BMP na 134, k 3.7, glu 140, cr 1.30  ____________________________________________   EKG  None  ____________________________________________    RADIOLOGY  Abd x-ray No acute abnormality  ____________________________________________   PROCEDURES  Procedures  ____________________________________________   INITIAL IMPRESSION / ASSESSMENT AND PLAN / ED COURSE  Pertinent labs & imaging results that were available during my care of the patient were reviewed by me and considered in my medical decision making (see chart for details).   Patient presented to the emergency department today with concerns for constipation.  Patient has blood work did not show any concerning electrolyte abnormality.  Creatinine was mildly elevated.  I discussed this with the patient.  Do think it is likely secondary to the patient not eating or drinking as much recently.  Patient was given an  enema with good output.  Patient states she felt much better afterwards.  Also had a complaint of a bug bite on her right shin.  On exam it looks like a localized reaction.  At this time doubt cellulitis.  Did discuss with patient return precautions.  ____________________________________________   FINAL CLINICAL IMPRESSION(S) / ED DIAGNOSES  Final diagnoses:  Constipation, unspecified constipation type     Note: This dictation was prepared with Dragon dictation. Any transcriptional errors that result from this process are unintentional     Nance Pear, MD 09/14/19 1728

## 2019-09-14 NOTE — ED Triage Notes (Addendum)
Pt arrives via POV for reports of constipation. Pt reports she has not had a good BM since last Wednesday and when she does have a BM it is small hard balls. Pt also reports a bite on her right lower leg that she would like looked at that came up on Sunday afternoon. Pt A&Ox4 and in NAD. Pt states she started ozempic for DM 2 weeks ago. Pt reports she has tried senna, dulocolax and suppositories with success.

## 2020-02-08 IMAGING — CT CT CHEST W/O CM
2 of 4 series · 15 of 36 positions shown, 18 images · non-contrast
Comparison: None.

CLINICAL DATA: Worsening shortness of breath on exertion for 6
months. Congestion in the morning.

EXAM:
CT CHEST WITHOUT CONTRAST
TECHNIQUE: Multidetector CT imaging of the chest was performed following the
standard protocol without IV contrast.

[Series 2: chest · axial · 0.62mm/px · z∈[-1214,-994]mm · 12 of 131 slices shown, 15 images (1 of 2)]
[im 11/131  mediastinal]
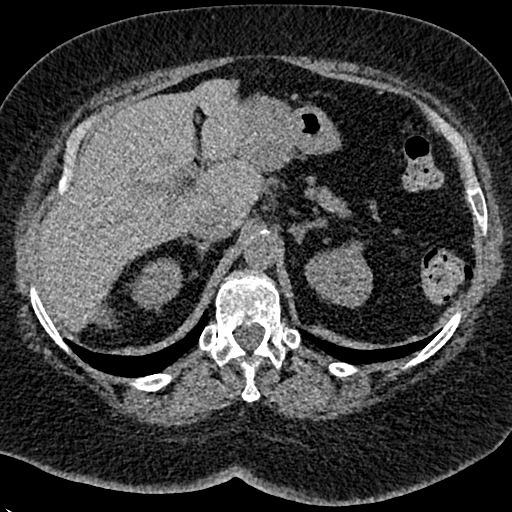
[im 11/131  lung]
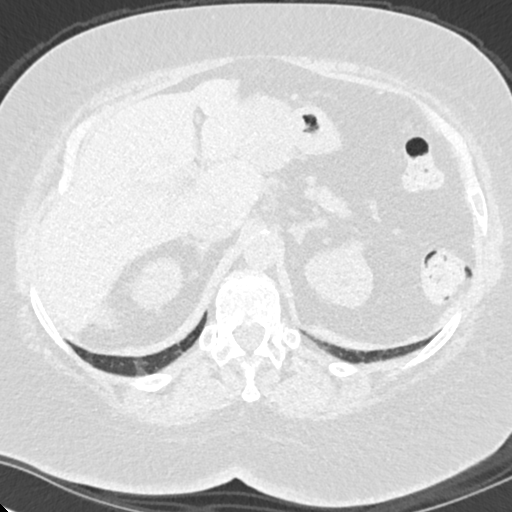
[im 21/131  lung]
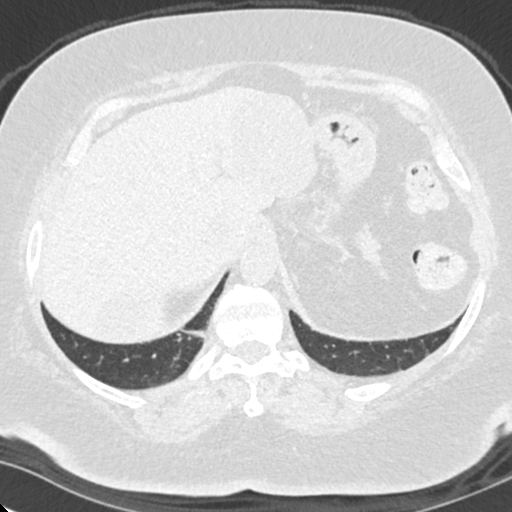
[im 31/131  lung]
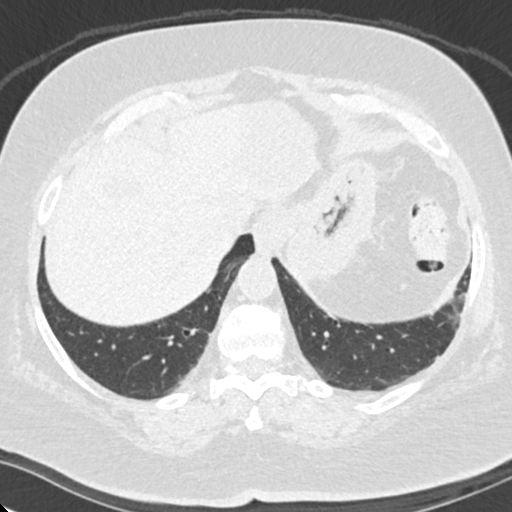
[im 41/131  lung]
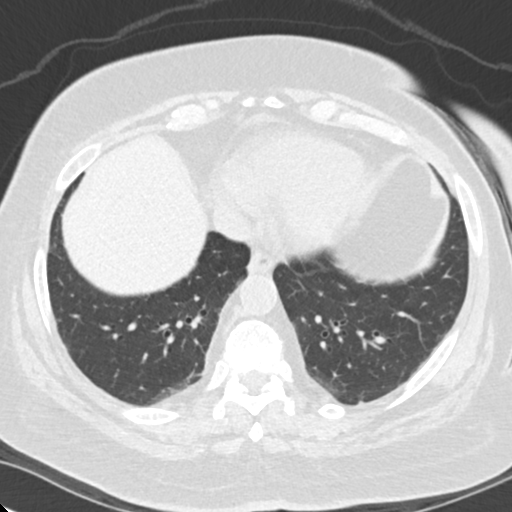
[im 51/131  mediastinal]
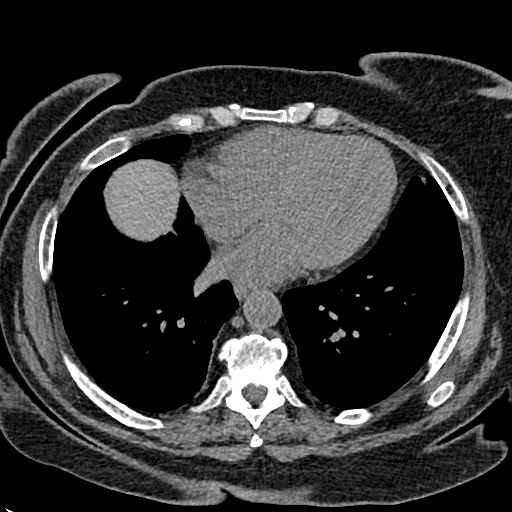
[im 51/131  lung]
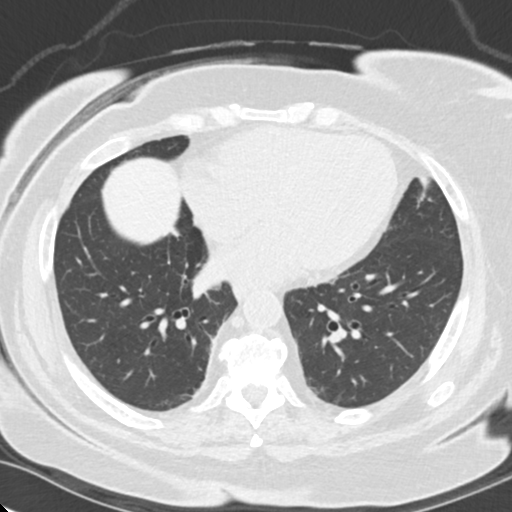
[im 61/131  lung]
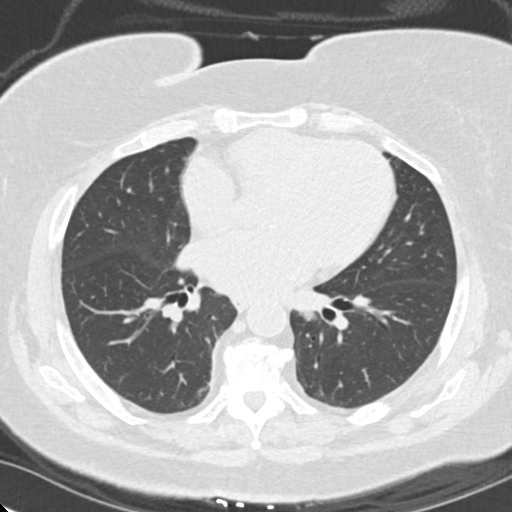
[im 71/131  lung]
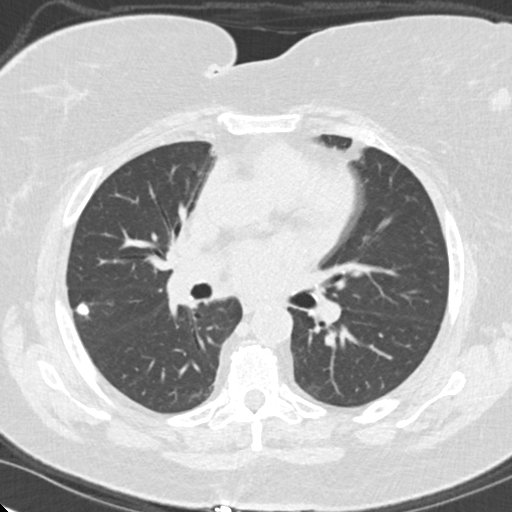
[im 81/131  lung]
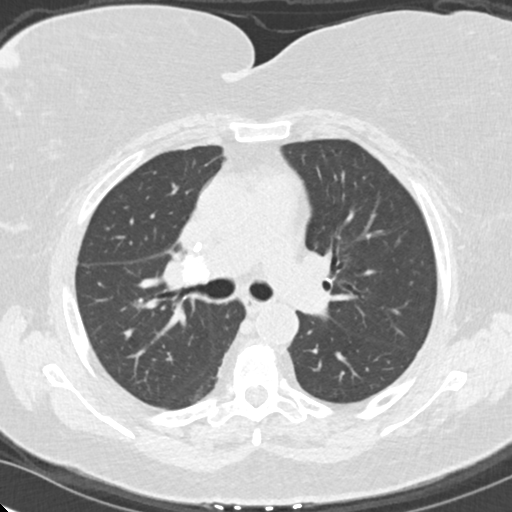
[im 91/131  mediastinal]
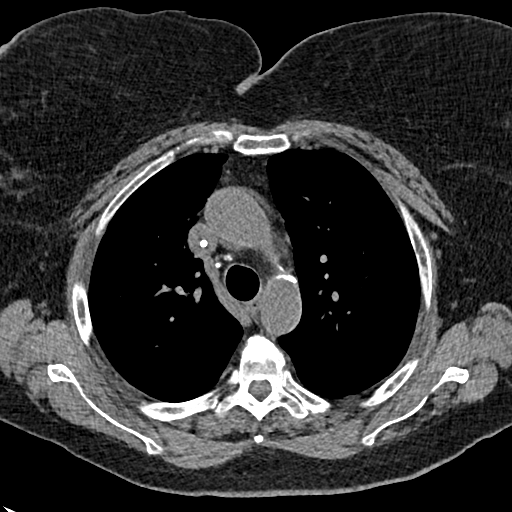
[im 91/131  lung]
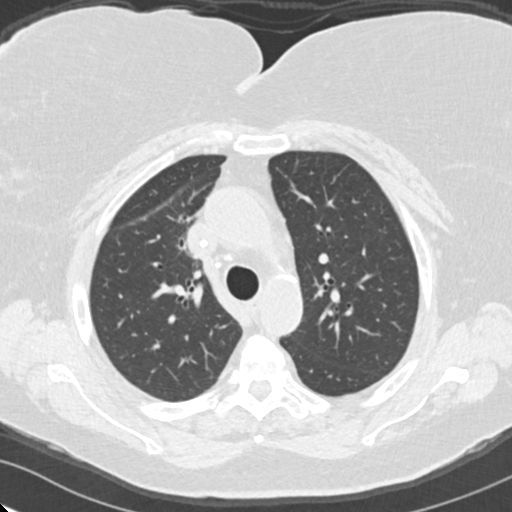
[im 101/131  lung]
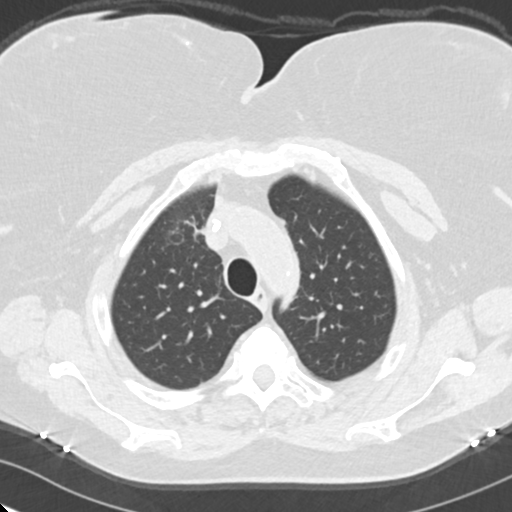
[im 111/131  lung]
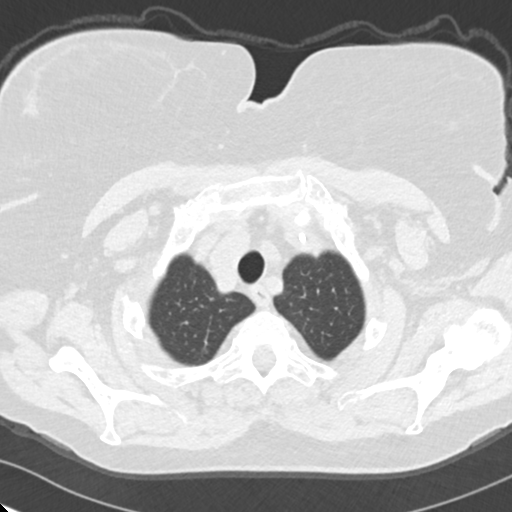
[im 121/131  lung]
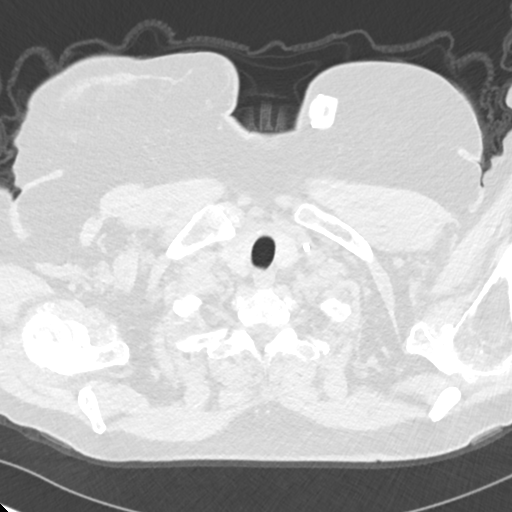

[Series 5: chest · coronal · 0.52mm/px · 3 of 155 slices shown (2 of 2)]
[im 31/155  lung]
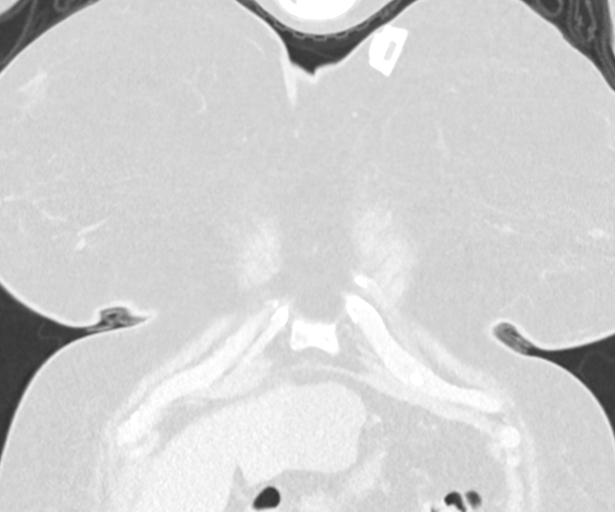
[im 62/155  lung]
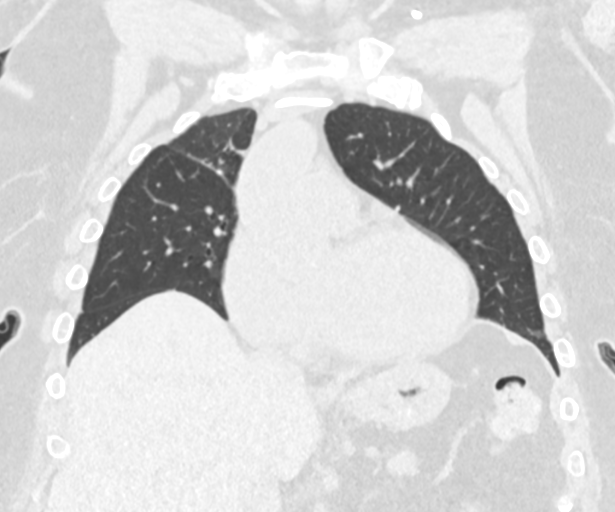
[im 93/155  lung]
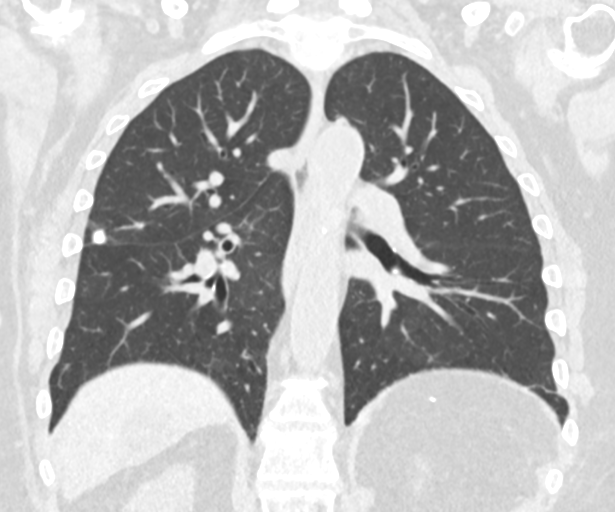

[15 of 36 positions shown; findings below may reference images not displayed]

FINDINGS: Cardiovascular: Left IJ Port-A-Cath terminates in the SVC.
Atherosclerotic calcification of the arterial vasculature, including
coronary arteries. Heart is at the upper limits of normal in size to
mildly enlarged. No pericardial effusion.

Mediastinum/Nodes: No pathologically enlarged mediastinal or
axillary lymph nodes. There are calcified mediastinal and right
hilar lymph nodes. Esophagus is grossly unremarkable.

Lungs/Pleura: There is volume loss in the anterior segment right
upper lobe, with narrowing of the anterior segmental bronchus
(series 3, image 48). Calcified granuloma in the right upper lobe.
Probable mild dependent atelectasis bilaterally. Subpleural scarring
in the apical segment right upper lobe. No pleural fluid. Airway is
otherwise unremarkable.

Upper Abdomen: There are mildly hypodense lesions in both lobes of
the liver, measuring up to 6.6 cm in the left hepatic lobe.
Visualized portions of the adrenal glands, kidneys, pancreas,
stomach and bowel are grossly unremarkable. Spleen is surgically
absent.

Musculoskeletal: Degenerative changes in the spine and shoulders. No
worrisome lytic or sclerotic lesions.
IMPRESSION: 1. Mild volume loss in the right upper lobe with associated
narrowing of the anterior segmental bronchus, secondary to adjacent
calcified lymph nodes. Fibrosing mediastinitis is not excluded.
2. Favor dependent atelectasis bilaterally. If there is concern for
interstitial lung disease, high-resolution chest CT with prone
imaging is recommended.
3. Mildly hypodense liver lesions are not indicative of cysts but
cannot be further characterized without IV contrast. MR abdomen
without and with contrast is recommended, as clinically indicated.
4. Aortic atherosclerosis ([id number]-170.0). Coronary artery
calcification.

## 2021-09-07 IMAGING — CR DG ABDOMEN 1V
2 series · 2 of 2 positions shown · non-contrast
Comparison: None.

CLINICAL DATA: Constipation

EXAM:
ABDOMEN - 1 VIEW

[abdomen kub (1 of 2)]
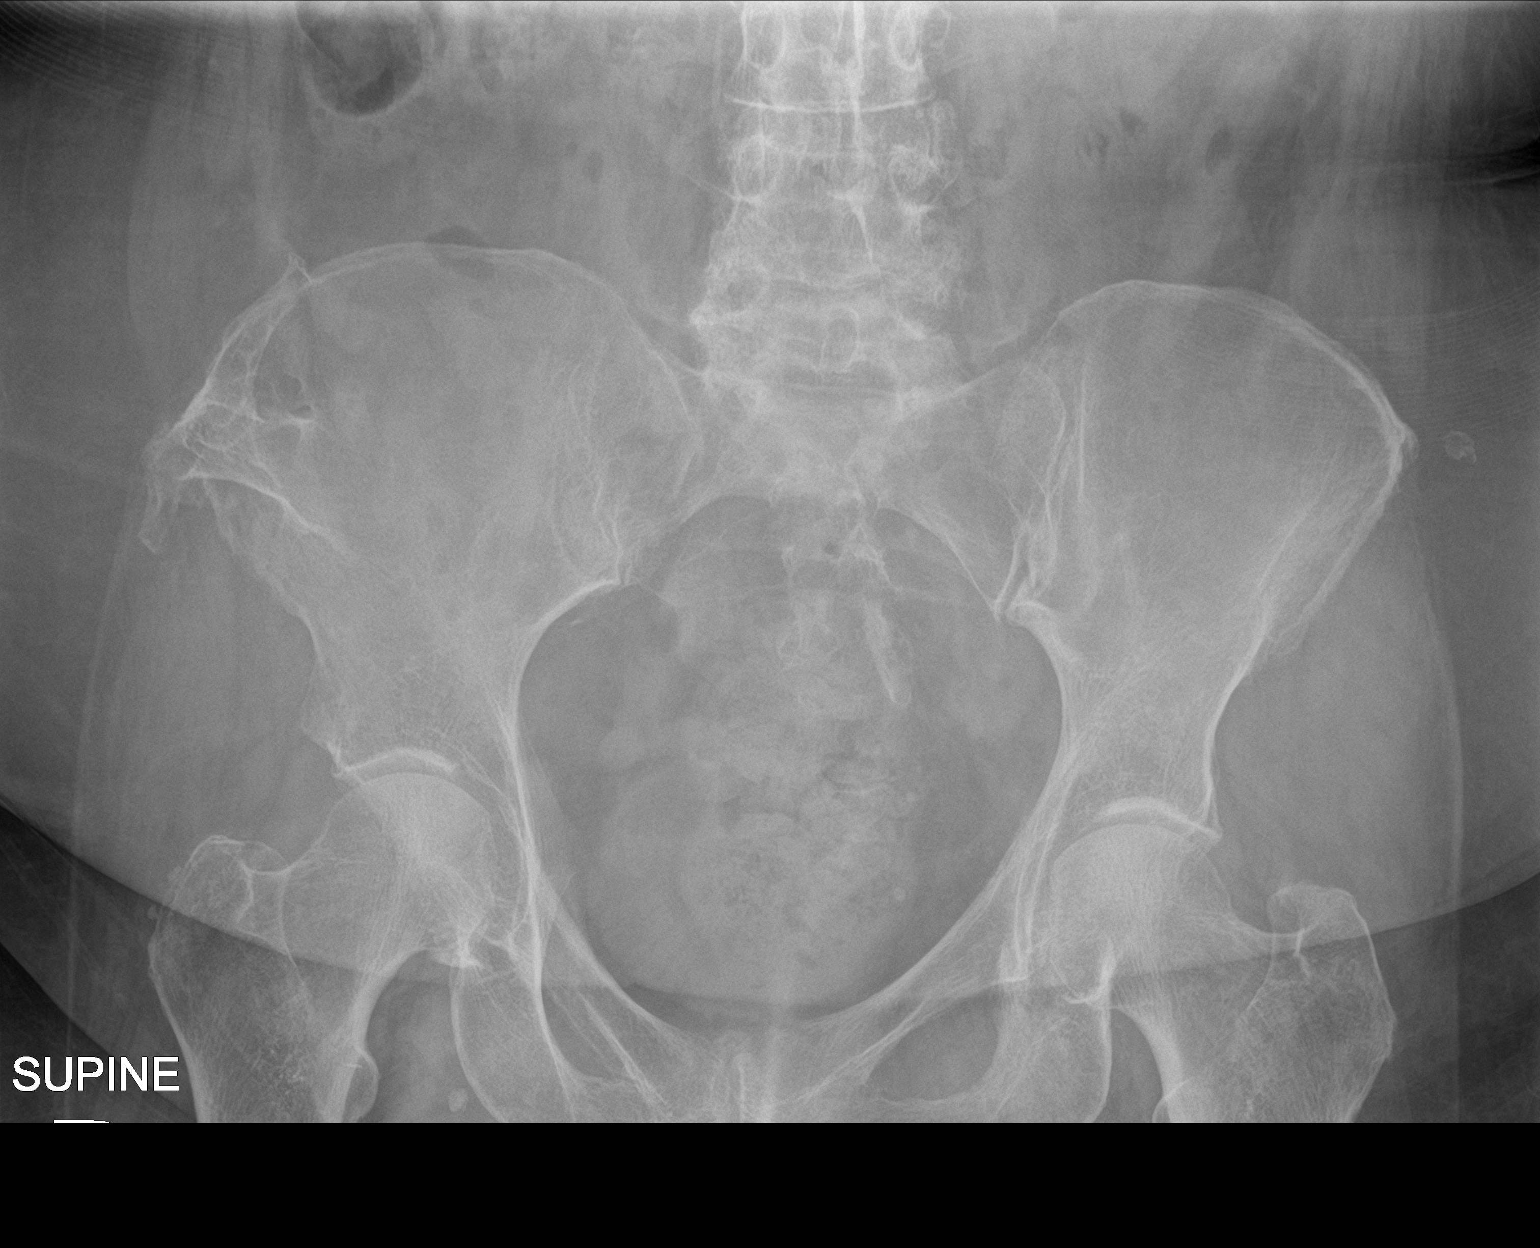

[abdomen kub (2 of 2)]
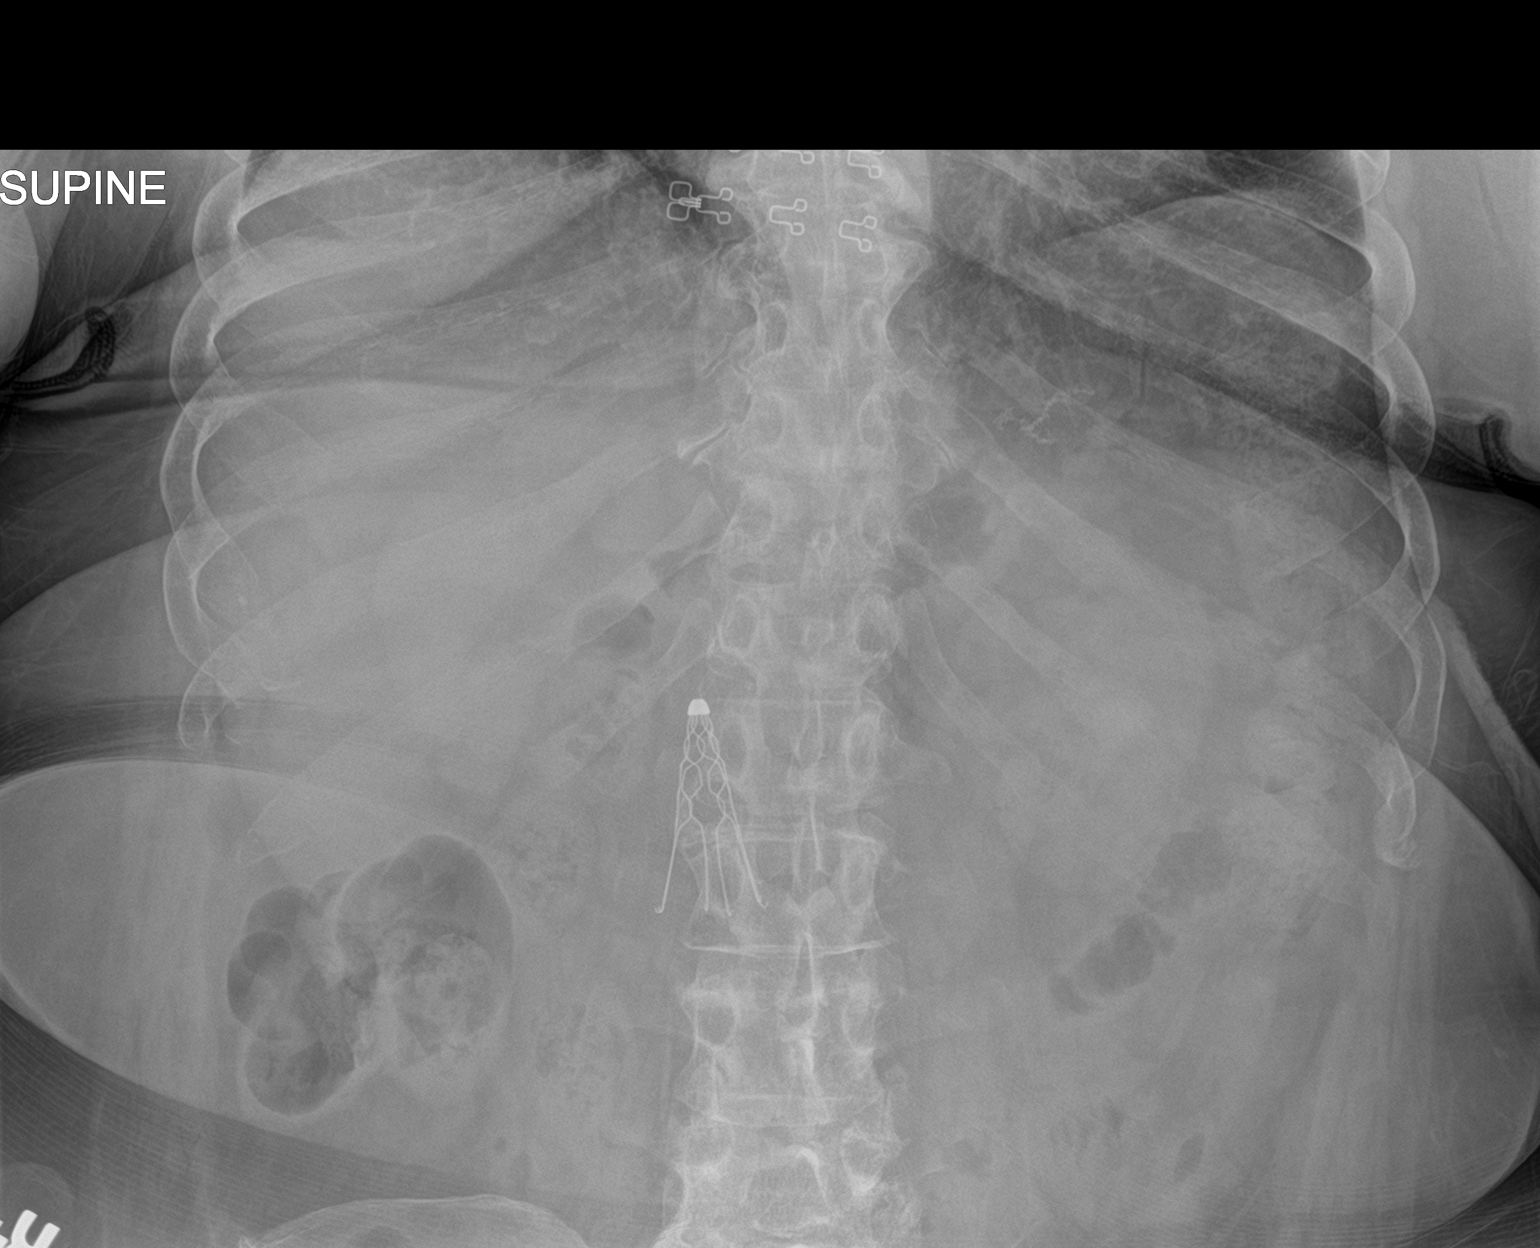

[2 of 2 positions shown; findings below may reference images not displayed]

FINDINGS: The bowel gas pattern is normal. No radio-opaque calculi or other
significant radiographic abnormality are seen. No significant
increase in stool burden. IVC filter in place.
IMPRESSION: No acute findings.

## 2021-09-09 ENCOUNTER — Other Ambulatory Visit: Payer: Self-pay

## 2021-09-09 ENCOUNTER — Emergency Department
Admission: EM | Admit: 2021-09-09 | Discharge: 2021-09-09 | Disposition: A | Discharge status: Home / Self Care | Payer: Medicare Other | Attending: Emergency Medicine | Admitting: Emergency Medicine

## 2021-09-09 ENCOUNTER — Encounter: Payer: Self-pay | Admitting: Emergency Medicine

## 2021-09-09 DIAGNOSIS — E119 Type 2 diabetes mellitus without complications: Secondary | ICD-10-CM | POA: Insufficient documentation

## 2021-09-09 DIAGNOSIS — I10 Essential (primary) hypertension: Secondary | ICD-10-CM | POA: Diagnosis not present

## 2021-09-09 DIAGNOSIS — Z452 Encounter for adjustment and management of vascular access device: Secondary | ICD-10-CM | POA: Diagnosis present

## 2021-09-09 DIAGNOSIS — Z789 Other specified health status: Secondary | ICD-10-CM

## 2021-09-09 MED ORDER — HEPARIN SOD (PORK) LOCK FLUSH 100 UNIT/ML IV SOLN
500.0000 [IU] | Freq: Once | INTRAVENOUS | Status: AC
Start: 2021-09-09 — End: 2021-09-09
  Administered 2021-09-09: 500 [IU] via INTRAVENOUS
  Filled 2021-09-09: qty 5

## 2021-09-09 NOTE — ED Triage Notes (Signed)
Left chest IV access.  Receiving 2 antibiotics at home.  Today when home health attempted to flush line, unable to flush.

## 2021-10-03 ENCOUNTER — Other Ambulatory Visit: Payer: Self-pay

## 2021-10-03 ENCOUNTER — Emergency Department
Admission: EM | Admit: 2021-10-03 | Discharge: 2021-10-03 | Discharge status: Against Medical Advice | Payer: Medicare Other | Attending: Emergency Medicine | Admitting: Emergency Medicine

## 2021-10-03 DIAGNOSIS — Z452 Encounter for adjustment and management of vascular access device: Secondary | ICD-10-CM | POA: Diagnosis present

## 2021-10-03 DIAGNOSIS — Z5321 Procedure and treatment not carried out due to patient leaving prior to being seen by health care provider: Secondary | ICD-10-CM | POA: Insufficient documentation

## 2021-10-03 NOTE — ED Provider Triage Note (Signed)
Emergency Medicine Provider Triage Evaluation Note  April Alexander , a 76 y.o. female  was evaluated in triage.  Pt complains of a port that will not flush.  Patient reports that she has had port reassessed in the past year.  Review of Systems  Positive: Patient has port access problem  Negative: No chest pain or abdominal pain.   Physical Exam  BP 136/66 (BP Location: Right Arm)   Pulse 70   Temp 98.3 F (36.8 C) (Oral)   Resp 17   SpO2 99%  Gen:   Awake, no distress   Resp:  Normal effort  MSK:   Moves extremities without difficulty  Other:    Medical Decision Making  Medically screening exam initiated at 5:50 PM.  Appropriate orders placed.  April Alexander was informed that the remainder of the evaluation will be completed by another provider, this initial triage assessment does not replace that evaluation, and the importance of remaining in the ED until their evaluation is complete.     April Alexander, [place] 10/03/21 1752

## 2021-10-03 NOTE — ED Triage Notes (Signed)
Pt presents with issues to L chest port access for at home ABX. Pt states amae issues happened 3 weeks ago. Pt states she does heparinize it at night. Pt denies any other complaints at this time.

## 2021-10-04 ENCOUNTER — Other Ambulatory Visit: Payer: Self-pay

## 2021-10-04 ENCOUNTER — Emergency Department: Payer: Medicare Other

## 2021-10-04 ENCOUNTER — Emergency Department
Admission: EM | Admit: 2021-10-04 | Discharge: 2021-10-04 | Disposition: A | Discharge status: Home / Self Care | Payer: Medicare Other | Attending: Emergency Medicine | Admitting: Emergency Medicine

## 2021-10-04 DIAGNOSIS — Z452 Encounter for adjustment and management of vascular access device: Secondary | ICD-10-CM | POA: Diagnosis present

## 2021-10-04 MED ORDER — ALTEPLASE 2 MG IJ SOLR
2.0000 mg | Freq: Once | INTRAMUSCULAR | Status: DC
  Filled 2021-10-04: qty 2

## 2021-10-04 MED ORDER — HEPARIN SOD (PORK) LOCK FLUSH 100 UNIT/ML IV SOLN
500.0000 [IU] | INTRAVENOUS | Status: AC | PRN
  Administered 2021-10-04: 500 [IU]

## 2021-10-04 NOTE — ED Triage Notes (Signed)
Pt ambulatory to triage with c/o port access problems. Port placed to left chest apx 6-7 yrs ago. Now used for IV antibiotics. Home Health Nurse changes port once weekly, pt administers meds herself.  Pt states she does flush with Heparin daily after med administration.  Denies issues or sx with insertion site, but states that the last time the [facility] nurse inserted port it didn't seem placed correctly

## 2021-10-04 NOTE — Progress Notes (Signed)
Pt arrived from home stating her PAC would not give blood return. Home health accessed 7/18. Pt on OPAT and self-*administers. Upon assessment. Dressing peeling and non-intact. After flushing with 34m NS, good blood return noted. Pt requested I reaccess due to non-intact dressing. Deaccessed, then reaccessed per protocol. Good blood return. Site dressed and heparinized with 500 units. RN aware.

## 2021-10-04 NOTE — ED Notes (Signed)
The pt was standing at the door way and advised she was ready to go. No vitals were assessed.

## 2021-10-04 NOTE — ED Notes (Signed)
IV team at bedside 

## 2021-10-04 NOTE — ED Notes (Signed)
Port was re-accessed by IV team nurse.

## 2021-10-04 NOTE — ED Notes (Signed)
Pt to ED for L port unable to flush since yesterday. Port was accessed on Tuesday and pt was giving self IV meds at home. Port currently has saline lock attached.

## 2021-10-04 NOTE — ED Provider Notes (Signed)
[facility] Provider Note    Event Date/Time   First MD Initiated Contact with Patient 10/04/21 [phone]     (approximate)   History   No chief complaint on file.   HPI  April Alexander is a 76 y.o. female here with port issues.  The patient currently has a left Port-A-Cath.  This has been in place for years.  She states that it was recently reaccessed at home.  She was able to give herself antibiotics through it for several days but has been unable to flush it for the last 2 days.  Denies any pain at the site.  She states that she feels like the person replaced it did not necessarily stick her in the right place.  No history of previous issues with the port.  No cough.  No sputum production.  No fevers.  No other complaints.  No rash.     Physical Exam   Triage Vital Signs: ED Triage Vitals  Enc Vitals Group     BP 10/04/21 0643 115/69     Pulse Rate 10/04/21 0643 73     Resp 10/04/21 0639 18     Temp 10/04/21 0639 98 F (36.7 C)     Temp Source 10/04/21 0639 Oral     SpO2 10/04/21 0639 (!) 9 %     Weight 10/04/21 0640 113 lb (51.3 kg)     Height 10/04/21 0640 '5\' 3"'$  (1.6 m)     Head Circumference --      Peak Flow --      Pain Score 10/04/21 0640 0     Pain Loc --      Pain Edu? --      Excl. in [place]? --     Most recent vital signs: Vitals:   10/04/21 0639 10/04/21 0643  BP:  115/69  Pulse:  73  Resp: 18 18  Temp: 98 F (36.7 C)   SpO2: (!) 9% 99%     General: Awake, no distress.  CV:  Good peripheral perfusion.  Resp:  Normal effort.  Abd:  No distention.  Other:  Left IJ Port-A-Cath in place, no surrounding erythema.  The dressing currently has a small amount of remaining sterile dressing at the site of the catheter access but otherwise has come undone.  There is no surrounding skin redness or drainage.   ED Results / Procedures / Treatments   Labs (all labs ordered are listed, but only abnormal results are displayed) Labs Reviewed  - No data to display   EKG    RADIOLOGY X-ray: Hypoinflation, left IJ Port-A-Cath with tip over the SVC   I also independently reviewed and agree with radiologist interpretations.   PROCEDURES:  Critical Care performed: No  MEDICATIONS ORDERED IN ED: Medications  heparin lock flush 100 unit/mL (500 Units Intracatheter Given 10/04/21 1220)     IMPRESSION / MDM / ASSESSMENT AND PLAN / ED COURSE  I reviewed the triage vital signs and the nursing notes.                               Ddx:  Differential includes the following, with pertinent life- or limb-threatening emergencies considered:  Port clot, displacement, faulty access point  Patient's presentation is most consistent with acute complicated illness / injury requiring diagnostic workup.  MDM:  76 year old female here with difficulty with flushing her left port.  She has a left IJ  port which has been present for years.  Denies any trauma to the area.  The skin does not appear infected.  There is no bleeding or hematoma.  X-rays show the catheter remains in place.  IV team was consulted, as the current dressing did not appear to be sterile and she had been having issues flushing.  Her port was reaccessed using sterile technique and is flushing well.  She will discharge home with good return precautions.   MEDICATIONS GIVEN IN ED: Medications  heparin lock flush 100 unit/mL (500 Units Intracatheter Given 10/04/21 1220)     Consults:     EMR reviewed       FINAL CLINICAL IMPRESSION(S) / ED DIAGNOSES   Final diagnoses:  Encounter for care related to vascular access port     Rx / DC Orders   ED Discharge Orders     None        Note:  This document was prepared using Dragon voice recognition software and may include unintentional dictation errors.   Duffy Bruce, MD 10/04/21 [phone]

## 2021-12-24 ENCOUNTER — Ambulatory Visit: Payer: Medicare Other | Admitting: Anesthesiology

## 2021-12-24 ENCOUNTER — Encounter
Admission: RE | Disposition: A | Discharge status: Home / Self Care | Payer: Self-pay | Source: Ambulatory Visit | Attending: Internal Medicine

## 2021-12-24 ENCOUNTER — Encounter: Payer: Self-pay | Admitting: Internal Medicine

## 2021-12-24 ENCOUNTER — Ambulatory Visit
Admission: RE | Admit: 2021-12-24 | Discharge: 2021-12-24 | Disposition: A | Discharge status: Home / Self Care | Payer: Medicare Other | Source: Ambulatory Visit | Attending: Internal Medicine | Admitting: Internal Medicine

## 2021-12-24 ENCOUNTER — Other Ambulatory Visit: Payer: Self-pay

## 2021-12-24 DIAGNOSIS — Z86718 Personal history of other venous thrombosis and embolism: Secondary | ICD-10-CM | POA: Insufficient documentation

## 2021-12-24 DIAGNOSIS — K317 Polyp of stomach and duodenum: Secondary | ICD-10-CM | POA: Insufficient documentation

## 2021-12-24 DIAGNOSIS — E119 Type 2 diabetes mellitus without complications: Secondary | ICD-10-CM | POA: Diagnosis not present

## 2021-12-24 DIAGNOSIS — Z86010 Personal history of colonic polyps: Secondary | ICD-10-CM | POA: Insufficient documentation

## 2021-12-24 DIAGNOSIS — Z87891 Personal history of nicotine dependence: Secondary | ICD-10-CM | POA: Diagnosis not present

## 2021-12-24 DIAGNOSIS — Z794 Long term (current) use of insulin: Secondary | ICD-10-CM | POA: Diagnosis not present

## 2021-12-24 DIAGNOSIS — D45 Polycythemia vera: Secondary | ICD-10-CM | POA: Insufficient documentation

## 2021-12-24 DIAGNOSIS — K2970 Gastritis, unspecified, without bleeding: Secondary | ICD-10-CM | POA: Diagnosis not present

## 2021-12-24 DIAGNOSIS — F4322 Adjustment disorder with anxiety: Secondary | ICD-10-CM | POA: Diagnosis not present

## 2021-12-24 DIAGNOSIS — Z1211 Encounter for screening for malignant neoplasm of colon: Secondary | ICD-10-CM | POA: Diagnosis present

## 2021-12-24 DIAGNOSIS — K640 First degree hemorrhoids: Secondary | ICD-10-CM | POA: Insufficient documentation

## 2021-12-24 DIAGNOSIS — K635 Polyp of colon: Secondary | ICD-10-CM | POA: Diagnosis not present

## 2021-12-24 DIAGNOSIS — K31A19 Gastric intestinal metaplasia without dysplasia, unspecified site: Secondary | ICD-10-CM | POA: Diagnosis not present

## 2021-12-24 DIAGNOSIS — I10 Essential (primary) hypertension: Secondary | ICD-10-CM | POA: Diagnosis not present

## 2021-12-24 HISTORY — PX: ESOPHAGOGASTRODUODENOSCOPY: SHX5428

## 2021-12-24 HISTORY — PX: COLONOSCOPY: SHX5424

## 2021-12-24 SURGERY — COLONOSCOPY
Anesthesia: General

## 2021-12-24 MED ORDER — PROPOFOL 500 MG/50ML IV EMUL
INTRAVENOUS | Status: DC | PRN
  Administered 2021-12-24: 150 ug/kg/min via INTRAVENOUS

## 2021-12-24 MED ORDER — PROPOFOL 10 MG/ML IV BOLUS
INTRAVENOUS | Status: DC | PRN
  Administered 2021-12-24: 80 mg via INTRAVENOUS

## 2021-12-24 MED ORDER — SODIUM CHLORIDE 0.9 % IV SOLN: INTRAVENOUS | Status: DC

## 2021-12-24 MED ORDER — LIDOCAINE HCL (CARDIAC) PF 100 MG/5ML IV SOSY
PREFILLED_SYRINGE | INTRAVENOUS | Status: DC | PRN
  Administered 2021-12-24: 100 mg via INTRAVENOUS

## 2021-12-24 NOTE — H&P (Signed)
Outpatient short stay form Pre-procedure 12/24/2021 9:54 AM April Alexander, M.D.  Primary Physician: Noralee Stain, M.D.  Reason for visit:  1. Gastric intestinal metaplasia                               2.  History of adenomatous colon polyps.  History of present illness:  Patient has hx of GIM of the stomach diagnosed at [facility] 3 years ago.                           Patient presents for colonoscopy for a personal hx of colon polyps. The patient denies abdominal pain, abnormal weight loss or rectal bleeding.      Current Facility-Administered Medications:    0.9 %  sodium chloride infusion, , Intravenous, Continuous, [place], Benay Pike, MD, Last Rate: 20 mL/hr at 12/24/21 0916, New Bag at 12/24/21 0916  Medications Prior to Admission  Medication Sig Dispense Refill Last Dose   Biotin 10 MG TABS Take 1 tablet by mouth daily.   12/23/2021   cholecalciferol (VITAMIN D) 1000 units tablet Take 1,000 Units by mouth daily.   12/23/2021   Coenzyme Q10 200 MG capsule Take 200 mg by mouth at bedtime.   12/23/2021   losartan (COZAAR) 25 MG tablet Take 25 mg by mouth daily.   12/23/2021   nebivolol (BYSTOLIC) 10 MG tablet Take 10 mg by mouth every morning.   12/23/2021   rosuvastatin (CRESTOR) 5 MG tablet Take 5 mg by mouth at bedtime.   12/23/2021   Semaglutide, 1 MG/DOSE, (OZEMPIC, 1 MG/DOSE,) 2 MG/1.5ML SOPN Inject into the skin.   12/21/2021   HYDROcodone-acetaminophen (NORCO) 5-325 MG tablet Take 1 tablet by mouth every 6 (six) hours as needed for moderate pain. (Patient not taking: Reported on 12/24/2021) 30 tablet 0 Not Taking   LORazepam (ATIVAN) 1 MG tablet Take 1 mg by mouth at bedtime as needed for anxiety or sleep. (Patient not taking: Reported on 12/24/2021)   Not Taking   polyethylene glycol (MIRALAX) 17 g packet Take 17 g by mouth daily. (Patient not taking: Reported on 12/24/2021) 14 each 0 Completed Course   promethazine (PHENERGAN) 12.5 MG tablet Take 1 tablet  (12.5 mg total) by mouth every 6 (six) hours as needed for nausea. 21 tablet 0    rivaroxaban (XARELTO) 20 MG TABS tablet Take 20 mg by mouth daily with supper.   12/21/2021   sitaGLIPtin (JANUVIA) 50 MG tablet Take 50 mg by mouth every morning. (Patient not taking: Reported on 12/24/2021)   Not Taking   valsartan (DIOVAN) 160 MG tablet Take 160 mg by mouth at bedtime. (Patient not taking: Reported on 12/24/2021)   Not Taking     Allergies  Allergen Reactions   Bactrim [Sulfamethoxazole-Trimethoprim] Other (See Comments)    Blisters   Neosporin [Neomycin-Bacitracin Zn-Polymyx] Other (See Comments)    Blisters   Oxycodone Nausea And Vomiting     Past Medical History:  Diagnosis Date   Adjustment disorder with anxiety    Diabetes mellitus without complication ([facility])    DVT (deep venous thrombosis) ([facility])    Hypertension    Iron overload    Lymphedema    right leg   Polycythemia    Polycythemia vera ([place])     Review of systems:  Otherwise negative.    Physical Exam  Gen: Alert, oriented. Appears stated age.  HEENT: /AT. PERRLA. Lungs: CTA, no wheezes. CV: RR nl S1, S2. Abd: soft, benign, no masses. BS+ Ext: No edema. Pulses 2+    Planned procedures: Proceed with EGD with mapping biopsies, colonoscopy. The patient understands the nature of the planned procedure, indications, risks, alternatives and potential complications including but not limited to bleeding, infection, perforation, damage to internal organs and possible oversedation/side effects from anesthesia. The patient agrees and gives consent to proceed.  Please refer to procedure notes for findings, recommendations and patient disposition/instructions.     Ventura Hollenbeck K. Alice Alexander, M.D. Gastroenterology 12/24/2021  9:54 AM

## 2021-12-24 NOTE — Op Note (Signed)
[facility] Gastroenterology Patient Name: April Alexander Procedure Date: 12/24/2021 10:00 AM MRN: [id number] Account #: [id number] Date of Birth: 22-Mar-1945 Admit Type: Outpatient Age: 76 Room: [facility] ENDO ROOM 2 Gender: Female Note Status: Finalized Instrument Name: Jasper Riling [id number] Procedure:             Colonoscopy Indications:           High risk colon cancer surveillance: Personal history                         of adenoma with villous component Providers:             Benay Pike. Alice Reichert MD, MD Referring MD:          Kenard Gower, MD Medicines:             Propofol per Anesthesia Complications:         No immediate complications. Procedure:             Pre-Anesthesia Assessment:                        - The risks and benefits of the procedure and the                         sedation options and risks were discussed with the                         patient. All questions were answered and informed                         consent was obtained.                        - Patient identification and proposed procedure were                         verified prior to the procedure by the nurse. The                         procedure was verified in the procedure room.                        - ASA Grade Assessment: III - A patient with severe                         systemic disease.                        - After reviewing the risks and benefits, the patient                         was deemed in satisfactory condition to undergo the                         procedure.                        After obtaining informed consent, the colonoscope was                         passed under direct  vision. Throughout the procedure,                         the patient's blood pressure, pulse, and oxygen                         saturations were monitored continuously. The                         Colonoscope was introduced through the anus and                         advanced to  the the cecum, identified by appendiceal                         orifice and ileocecal valve. The colonoscopy was                         performed without difficulty. The patient tolerated                         the procedure well. The quality of the bowel                         preparation was adequate. The ileocecal valve,                         appendiceal orifice, and rectum were photographed. Findings:      The perianal and digital rectal examinations were normal.      Non-bleeding internal hemorrhoids were found during retroflexion. The       hemorrhoids were Grade I (internal hemorrhoids that do not prolapse).      A 4 mm polyp was found in the sigmoid colon. The polyp was sessile. The       polyp was removed with a jumbo cold forceps. Resection and retrieval       were complete.      The exam was otherwise without abnormality. Impression:            - Non-bleeding internal hemorrhoids.                        - One 4 mm polyp in the sigmoid colon, removed with a                         jumbo cold forceps. Resected and retrieved.                        - The examination was otherwise normal. Recommendation:        - Patient has a contact number available for                         emergencies. The signs and symptoms of potential                         delayed complications were discussed with the patient.                         Return to normal activities tomorrow. Written  discharge instructions were provided to the patient.                        - Await pathology results from EGD, also performed                         today.                        - Resume previous diet.                        - Continue present medications.                        - If polyps are benign or adenomatous without                         dysplasia, I will advise NO further colonoscopy due to                         advanced age and/or severe comorbidity.                         - Return to my office in 3 months.                        - Telephone GI office for pathology results.                        - Telephone GI office to schedule appointment.                        - The findings and recommendations were discussed with                         the patient. Procedure Code(s):     --- Professional ---                        [phone], Colonoscopy, flexible; with biopsy, single or                         multiple Diagnosis Code(s):     --- Professional ---                        K64.0, First degree hemorrhoids                        K63.5, Polyp of colon                        Z86.010, Personal history of colonic polyps CPT copyright 2019 [employer]. All rights reserved. The codes documented in this report are preliminary and upon coder review may  be revised to meet current compliance requirements. Efrain Sella MD, MD 12/24/2021 10:39:15 AM This report has been signed electronically. Number of Addenda: 0 Note Initiated On: 12/24/2021 10:00 AM Scope Withdrawal Time: 0 hours 4 minutes 11 seconds  Total Procedure Duration: 0 hours 8 minutes 40 seconds  Estimated Blood Loss:  Estimated blood loss: none.  [facility]

## 2021-12-24 NOTE — Transfer of Care (Signed)
Immediate Anesthesia Transfer of Care Note  Patient: April Alexander  Procedure(s) Performed: COLONOSCOPY ESOPHAGOGASTRODUODENOSCOPY (EGD)  Patient Location: Endoscopy Unit  Anesthesia Type:General  Level of Consciousness: drowsy  Airway & Oxygen Therapy: Patient Spontanous Breathing  Post-op Assessment: Report given to RN and Post -op Vital signs reviewed and stable  Post vital signs: Reviewed and stable  Last Vitals:  Vitals Value Taken Time  BP 112/65 12/24/21 1040  Temp 36.6 C 12/24/21 1040  Pulse 79 12/24/21 1040  Resp 20 12/24/21 1040  SpO2 100 % 12/24/21 1040  Vitals shown include unvalidated device data.  Last Pain:  Vitals:   12/24/21 1040  TempSrc: Temporal  PainSc: 0-No pain         Complications: No notable events documented.

## 2021-12-24 NOTE — Anesthesia Preprocedure Evaluation (Signed)
Anesthesia Evaluation  Patient identified by MRN, date of birth, ID band Patient awake    Reviewed: Allergy & Precautions, NPO status , Patient's Chart, lab work & pertinent test results  History of Anesthesia Complications Negative for: history of anesthetic complications  Airway Mallampati: III  TM Distance: >3 FB Neck ROM: full    Dental  (+) Chipped, Poor Dentition, Missing, Partial Lower   Pulmonary neg shortness of breath, former smoker,    Pulmonary exam normal        Cardiovascular Exercise Tolerance: Good hypertension, (-) anginaNormal cardiovascular exam     Neuro/Psych negative neurological ROS  negative psych ROS   GI/Hepatic negative GI ROS, Neg liver ROS, neg GERD  ,  Endo/Other  negative endocrine ROSdiabetes, Type 2, Insulin Dependent  Renal/GU negative Renal ROS  negative genitourinary   Musculoskeletal   Abdominal   Peds  Hematology negative hematology ROS (+)   Anesthesia Other Findings Past Medical History: No date: Adjustment disorder with anxiety No date: Diabetes mellitus without complication (HCC) No date: DVT (deep venous thrombosis) (HCC) No date: Hypertension No date: Iron overload No date: Lymphedema     Comment:  right leg No date: Polycythemia 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)  Past Surgical History: No date: ABDOMINAL HYSTERECTOMY 08/07/2016: ARTHRODESIS METATARSALPHALANGEAL JOINT (MTPJ); Left     Comment:  Procedure: ARTHRODESIS METATARSALPHALANGEAL JOINT               (MTPJ)-Left ;  Surgeon: Samara Deist, DPM;  Location:               [facility] ORS;  Service: Podiatry;  Laterality: Left; 08/07/2016: HAMMER TOE SURGERY; Left     Comment:  Procedure: HAMMER TOE CORRECTION-Left 2nd, 3rd & 4th               left toes ;  Surgeon: Samara Deist, DPM;  Location:               [facility] ORS;  Service: Podiatry;  Laterality: Left; 1992: IVC FILTER INSERTION 2018: PORTA CATH INSERTION;  Left No date: REPLACEMENT TOTAL KNEE; Bilateral No date: SPLENECTOMY     Reproductive/Obstetrics negative OB ROS                             Anesthesia Physical Anesthesia Plan  ASA: 3  Anesthesia Plan: General   Post-op Pain Management:    Induction: Intravenous  PONV Risk Score and Plan: Propofol infusion and TIVA  Airway Management Planned: Natural Airway and Nasal Cannula  Additional Equipment:   Intra-op Plan:   Post-operative Plan:   Informed Consent: I have reviewed the patients History and Physical, chart, labs and discussed the procedure including the risks, benefits and alternatives for the proposed anesthesia with the patient or authorized representative who has indicated his/her understanding and acceptance.     Dental Advisory Given  Plan Discussed with: Anesthesiologist, CRNA and Surgeon  Anesthesia Plan Comments: (Patient consented for risks of anesthesia including but not limited to:  - adverse reactions to medications - risk of airway placement if required - damage to eyes, teeth, lips or other oral mucosa - nerve damage due to positioning  - sore throat or hoarseness - Damage to heart, brain, nerves, lungs, other parts of body or loss of life  Patient voiced understanding.)        Anesthesia Quick Evaluation

## 2021-12-24 NOTE — Interval H&P Note (Signed)
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  The various methods of treatment have been discussed with the patient and family. After consideration of risks, benefits and other options for treatment, the patient has consented to  Procedure(s): COLONOSCOPY (N/A) ESOPHAGOGASTRODUODENOSCOPY (EGD) (N/A) as a surgical intervention.  The patient's history has been reviewed, patient examined, no change in status, stable for surgery.  I have reviewed the patient's chart and labs.  Questions were answered to the patient's satisfaction.     [place], [place]

## 2021-12-24 NOTE — Op Note (Signed)
[facility] Gastroenterology Patient Name: April Alexander Procedure Date: 12/24/2021 10:02 AM MRN: [id number] Account #: [id number] Date of Birth: 03-29-1945 Admit Type: Outpatient Age: 76 Room: [facility] ENDO ROOM 2 Gender: Female Note Status: Finalized Instrument Name: Upper Endoscope [id number] Procedure:             Upper GI endoscopy Indications:           Gastric intestinal metaplasia Providers:             Benay Pike. Alice Reichert MD, MD Referring MD:          Kenard Gower, MD Medicines:             Propofol per Anesthesia Complications:         No immediate complications. Estimated blood loss:                         Minimal. Procedure:             Pre-Anesthesia Assessment:                        - The risks and benefits of the procedure and the                         sedation options and risks were discussed with the                         patient. All questions were answered and informed                         consent was obtained.                        - Patient identification and proposed procedure were                         verified prior to the procedure by the nurse. The                         procedure was verified in the procedure room.                        - ASA Grade Assessment: III - A patient with severe                         systemic disease.                        - After reviewing the risks and benefits, the patient                         was deemed in satisfactory condition to undergo the                         procedure.                        After obtaining informed consent, the endoscope was                         passed under direct vision. Throughout  the procedure,                         the patient's blood pressure, pulse, and oxygen                         saturations were monitored continuously. The Endoscope                         was introduced through the mouth, and advanced to the                         third part of  duodenum. The upper GI endoscopy was                         accomplished without difficulty. The patient tolerated                         the procedure well. Findings:      The esophagus was normal.      Diffuse mild inflammation characterized by congestion (edema) and       erythema was found in the entire examined stomach. Biopsy mapping       performed with biopsies obtained from the prepyloric stomach (lesser and       greater curvature), incisural angularis and body (lesser and greater       curvature). 5 biopsies total submitted in one jar. Estimated blood loss       was minimal.      A few medium pedunculated polyps with no bleeding and no stigmata of       recent bleeding were found in the gastric body. Biopsies were taken with       a cold forceps for histology.      The exam of the stomach was otherwise normal.      The examined duodenum was normal.      The exam was otherwise without abnormality. Impression:            - Normal esophagus.                        - Gastritis.                        - A few gastric polyps. Biopsied.                        - Normal examined duodenum.                        - The examination was otherwise normal. Recommendation:        - Await pathology results.                        - Proceed with colonoscopy Procedure Code(s):     --- Professional ---                        [phone], Esophagogastroduodenoscopy, flexible,                         transoral; with biopsy, single or multiple Diagnosis Code(s):     --- Professional ---  K31.7, Polyp of stomach and duodenum                        K29.70, Gastritis, unspecified, without bleeding CPT copyright 2019 American [employer] Association. All rights reserved. The codes documented in this report are preliminary and upon coder review may  be revised to meet current compliance requirements. Efrain Sella MD, MD 12/24/2021 10:24:26 AM This report has been signed  electronically. Number of Addenda: 0 Note Initiated On: 12/24/2021 10:02 AM Estimated Blood Loss:  Estimated blood loss was minimal.      [facility]

## 2021-12-24 NOTE — Anesthesia Postprocedure Evaluation (Signed)
Anesthesia Post Note  Patient: April Alexander  Procedure(s) Performed: COLONOSCOPY ESOPHAGOGASTRODUODENOSCOPY (EGD)  Patient location during evaluation: Endoscopy Anesthesia Type: General Level of consciousness: awake and alert Pain management: pain level controlled Vital Signs Assessment: post-procedure vital signs reviewed and stable Respiratory status: spontaneous breathing, nonlabored ventilation, respiratory function stable and patient connected to nasal cannula oxygen Cardiovascular status: blood pressure returned to baseline and stable Postop Assessment: no apparent nausea or vomiting Anesthetic complications: no   No notable events documented.   Last Vitals:  Vitals:   12/24/21 1050 12/24/21 1100  BP: 138/79 (!) 144/74  Pulse: 79 69  Resp:  18  Temp:    SpO2: 100% 100%    Last Pain:  Vitals:   12/24/21 1100  TempSrc:   PainSc: 0-No pain                 Precious Haws Jaron Czarnecki

## 2021-12-25 ENCOUNTER — Encounter: Payer: Self-pay | Admitting: Internal Medicine

## 2021-12-25 LAB — SURGICAL PATHOLOGY

## 2022-01-12 ENCOUNTER — Other Ambulatory Visit: Payer: Self-pay | Admitting: Family Medicine

## 2022-01-12 DIAGNOSIS — R200 Anesthesia of skin: Secondary | ICD-10-CM

## 2022-01-12 DIAGNOSIS — R55 Syncope and collapse: Secondary | ICD-10-CM

## 2022-01-14 ENCOUNTER — Ambulatory Visit
Admission: RE | Admit: 2022-01-14 | Discharge: 2022-01-14 | Disposition: A | Discharge status: Home / Self Care | Payer: Medicare Other | Source: Ambulatory Visit | Attending: Family Medicine | Admitting: Family Medicine

## 2022-01-14 DIAGNOSIS — R200 Anesthesia of skin: Secondary | ICD-10-CM | POA: Insufficient documentation

## 2022-01-14 DIAGNOSIS — R55 Syncope and collapse: Secondary | ICD-10-CM | POA: Diagnosis present

## 2022-01-14 MED ORDER — IOHEXOL 350 MG/ML SOLN
75.0000 mL | Freq: Once | INTRAVENOUS | Status: AC | PRN
  Administered 2022-01-14: 75 mL via INTRAVENOUS

## 2023-08-10 ENCOUNTER — Other Ambulatory Visit
Admission: RE | Admit: 2023-08-10 | Discharge: 2023-08-10 | Disposition: A | Discharge status: Home / Self Care | Source: Ambulatory Visit | Attending: Ophthalmology | Admitting: Ophthalmology

## 2023-08-10 ENCOUNTER — Other Ambulatory Visit: Payer: Self-pay | Admitting: Ophthalmology

## 2023-08-10 ENCOUNTER — Encounter: Payer: Self-pay | Admitting: Ophthalmology

## 2023-08-10 DIAGNOSIS — H5462 Unqualified visual loss, left eye, normal vision right eye: Secondary | ICD-10-CM | POA: Insufficient documentation

## 2023-08-10 DIAGNOSIS — H53132 Sudden visual loss, left eye: Secondary | ICD-10-CM

## 2023-08-10 LAB — PLATELET COUNT: Platelets: 260 10*3/uL (ref 150–400)

## 2023-08-10 LAB — C-REACTIVE PROTEIN: CRP: <0.5 mg/dL (ref <1.0)

## 2023-08-10 LAB — SEDIMENTATION RATE: Sed Rate: 30 mm/h (ref 0–30)

## 2023-08-12 ENCOUNTER — Ambulatory Visit: Admission: RE | Admit: 2023-08-12 | Source: Ambulatory Visit

## 2023-08-17 ENCOUNTER — Ambulatory Visit
Admission: RE | Admit: 2023-08-17 | Discharge: 2023-08-17 | Disposition: A | Discharge status: Home / Self Care | Source: Ambulatory Visit | Attending: Ophthalmology | Admitting: Ophthalmology

## 2023-08-17 DIAGNOSIS — H53132 Sudden visual loss, left eye: Secondary | ICD-10-CM | POA: Insufficient documentation

## 2023-08-17 LAB — POCT I-STAT CREATININE: Creatinine, Ser: 1.1 mg/dL — ABNORMAL HIGH (ref 0.44–1.00)

## 2023-08-17 MED ORDER — IOHEXOL 300 MG/ML  SOLN
75.0000 mL | Freq: Once | INTRAMUSCULAR | Status: AC | PRN
  Administered 2023-08-17: 75 mL via INTRAVENOUS
